# Patient Record
Sex: Female | Born: 1945 | Race: White | Hispanic: No | Smoking: Never smoker
Health system: Southern US, Community
[De-identification: ages and names within clinical notes are randomized; demographics above are authoritative.]

## PROBLEM LIST (undated history)

## (undated) DIAGNOSIS — K219 Gastro-esophageal reflux disease without esophagitis: Secondary | ICD-10-CM

## (undated) DIAGNOSIS — R112 Nausea with vomiting, unspecified: Secondary | ICD-10-CM

## (undated) DIAGNOSIS — F329 Major depressive disorder, single episode, unspecified: Secondary | ICD-10-CM

## (undated) DIAGNOSIS — M1990 Unspecified osteoarthritis, unspecified site: Secondary | ICD-10-CM

## (undated) DIAGNOSIS — J189 Pneumonia, unspecified organism: Secondary | ICD-10-CM

## (undated) DIAGNOSIS — Z9889 Other specified postprocedural states: Secondary | ICD-10-CM

## (undated) DIAGNOSIS — I10 Essential (primary) hypertension: Secondary | ICD-10-CM

## (undated) DIAGNOSIS — G459 Transient cerebral ischemic attack, unspecified: Secondary | ICD-10-CM

## (undated) DIAGNOSIS — F419 Anxiety disorder, unspecified: Secondary | ICD-10-CM

## (undated) DIAGNOSIS — Z9289 Personal history of other medical treatment: Secondary | ICD-10-CM

## (undated) DIAGNOSIS — G43909 Migraine, unspecified, not intractable, without status migrainosus: Secondary | ICD-10-CM

## (undated) DIAGNOSIS — K9041 Non-celiac gluten sensitivity: Secondary | ICD-10-CM

## (undated) DIAGNOSIS — J42 Unspecified chronic bronchitis: Secondary | ICD-10-CM

## (undated) DIAGNOSIS — M810 Age-related osteoporosis without current pathological fracture: Secondary | ICD-10-CM

## (undated) DIAGNOSIS — C73 Malignant neoplasm of thyroid gland: Secondary | ICD-10-CM

## (undated) HISTORY — PX: COLONOSCOPY: SHX174

## (undated) HISTORY — DX: Transient cerebral ischemic attack, unspecified: G45.9

## (undated) HISTORY — PX: TONSILLECTOMY: SUR1361

## (undated) HISTORY — PX: TUBAL LIGATION: SHX77

## (undated) HISTORY — DX: Essential (primary) hypertension: I10

## (undated) HISTORY — PX: MOLE REMOVAL: SHX2046

---
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
## 1997-11-27 ENCOUNTER — Other Ambulatory Visit: Admission: RE | Admit: 1997-11-27 | Discharge: 1997-11-27 | Payer: Self-pay | Admitting: Obstetrics & Gynecology

## 1999-03-25 ENCOUNTER — Other Ambulatory Visit: Admission: RE | Admit: 1999-03-25 | Discharge: 1999-03-25 | Payer: Self-pay | Admitting: Obstetrics & Gynecology

## 2002-06-06 ENCOUNTER — Other Ambulatory Visit: Admission: RE | Admit: 2002-06-06 | Discharge: 2002-06-06 | Payer: Self-pay | Admitting: Obstetrics & Gynecology

## 2012-08-14 ENCOUNTER — Other Ambulatory Visit: Payer: Self-pay | Admitting: Family Medicine

## 2012-08-14 ENCOUNTER — Other Ambulatory Visit (HOSPITAL_COMMUNITY)
Admission: RE | Admit: 2012-08-14 | Discharge: 2012-08-14 | Disposition: A | Discharge status: Home / Self Care | Payer: Medicare Other | Source: Ambulatory Visit | Attending: Family Medicine | Admitting: Family Medicine

## 2012-08-14 DIAGNOSIS — Z124 Encounter for screening for malignant neoplasm of cervix: Secondary | ICD-10-CM | POA: Insufficient documentation

## 2012-08-16 ENCOUNTER — Other Ambulatory Visit: Payer: Self-pay

## 2012-08-16 DIAGNOSIS — Z1231 Encounter for screening mammogram for malignant neoplasm of breast: Secondary | ICD-10-CM

## 2012-09-18 ENCOUNTER — Ambulatory Visit
Admission: RE | Admit: 2012-09-18 | Discharge: 2012-09-18 | Disposition: A | Discharge status: Home / Self Care | Payer: Medicare Other | Source: Ambulatory Visit

## 2012-09-18 DIAGNOSIS — Z1231 Encounter for screening mammogram for malignant neoplasm of breast: Secondary | ICD-10-CM

## 2012-11-04 ENCOUNTER — Other Ambulatory Visit: Payer: Self-pay | Admitting: Gastroenterology

## 2016-01-12 DIAGNOSIS — J189 Pneumonia, unspecified organism: Secondary | ICD-10-CM

## 2016-01-12 HISTORY — DX: Pneumonia, unspecified organism: J18.9

## 2016-01-20 ENCOUNTER — Other Ambulatory Visit: Payer: Self-pay | Admitting: Family Medicine

## 2016-01-20 ENCOUNTER — Ambulatory Visit
Admission: RE | Admit: 2016-01-20 | Discharge: 2016-01-20 | Disposition: A | Discharge status: Home / Self Care | Payer: Medicare Other | Source: Ambulatory Visit | Attending: Family Medicine | Admitting: Family Medicine

## 2016-01-20 DIAGNOSIS — R05 Cough: Secondary | ICD-10-CM

## 2016-01-20 DIAGNOSIS — R059 Cough, unspecified: Secondary | ICD-10-CM

## 2016-02-10 ENCOUNTER — Ambulatory Visit
Admission: RE | Admit: 2016-02-10 | Discharge: 2016-02-10 | Disposition: A | Discharge status: Home / Self Care | Payer: Medicare Other | Source: Ambulatory Visit | Attending: Family Medicine | Admitting: Family Medicine

## 2016-02-10 ENCOUNTER — Other Ambulatory Visit: Payer: Self-pay | Admitting: Family Medicine

## 2016-02-10 DIAGNOSIS — R059 Cough, unspecified: Secondary | ICD-10-CM

## 2016-02-10 DIAGNOSIS — R05 Cough: Secondary | ICD-10-CM

## 2016-02-14 ENCOUNTER — Other Ambulatory Visit: Payer: Self-pay | Admitting: Family Medicine

## 2016-02-14 DIAGNOSIS — R9389 Abnormal findings on diagnostic imaging of other specified body structures: Secondary | ICD-10-CM

## 2016-02-21 ENCOUNTER — Ambulatory Visit
Admission: RE | Admit: 2016-02-21 | Discharge: 2016-02-21 | Disposition: A | Discharge status: Home / Self Care | Payer: Medicare Other | Source: Ambulatory Visit | Attending: Family Medicine | Admitting: Family Medicine

## 2016-02-21 DIAGNOSIS — R9389 Abnormal findings on diagnostic imaging of other specified body structures: Secondary | ICD-10-CM

## 2016-02-25 ENCOUNTER — Other Ambulatory Visit: Payer: Self-pay | Admitting: Family Medicine

## 2016-02-25 DIAGNOSIS — E049 Nontoxic goiter, unspecified: Secondary | ICD-10-CM

## 2016-03-01 ENCOUNTER — Ambulatory Visit
Admission: RE | Admit: 2016-03-01 | Discharge: 2016-03-01 | Disposition: A | Discharge status: Home / Self Care | Payer: Medicare Other | Source: Ambulatory Visit | Attending: Family Medicine | Admitting: Family Medicine

## 2016-03-01 DIAGNOSIS — E049 Nontoxic goiter, unspecified: Secondary | ICD-10-CM

## 2016-03-03 ENCOUNTER — Other Ambulatory Visit: Payer: Self-pay | Admitting: Family Medicine

## 2016-03-03 DIAGNOSIS — Z1231 Encounter for screening mammogram for malignant neoplasm of breast: Secondary | ICD-10-CM

## 2016-03-03 DIAGNOSIS — E042 Nontoxic multinodular goiter: Secondary | ICD-10-CM

## 2016-03-21 ENCOUNTER — Other Ambulatory Visit: Payer: Medicare Other

## 2016-03-21 ENCOUNTER — Inpatient Hospital Stay: Admission: RE | Admit: 2016-03-21 | Payer: Medicare Other | Source: Ambulatory Visit

## 2016-03-27 ENCOUNTER — Ambulatory Visit
Admission: RE | Admit: 2016-03-27 | Discharge: 2016-03-27 | Disposition: A | Discharge status: Home / Self Care | Payer: Medicare Other | Source: Ambulatory Visit | Attending: Family Medicine | Admitting: Family Medicine

## 2016-03-27 DIAGNOSIS — Z1231 Encounter for screening mammogram for malignant neoplasm of breast: Secondary | ICD-10-CM

## 2016-04-04 ENCOUNTER — Other Ambulatory Visit (HOSPITAL_COMMUNITY)
Admission: RE | Admit: 2016-04-04 | Discharge: 2016-04-04 | Disposition: A | Discharge status: Home / Self Care | Payer: Medicare Other | Source: Ambulatory Visit | Attending: Radiology | Admitting: Radiology

## 2016-04-04 ENCOUNTER — Ambulatory Visit
Admission: RE | Admit: 2016-04-04 | Discharge: 2016-04-04 | Disposition: A | Discharge status: Home / Self Care | Payer: Medicare Other | Source: Ambulatory Visit | Attending: Family Medicine | Admitting: Family Medicine

## 2016-04-04 DIAGNOSIS — E042 Nontoxic multinodular goiter: Secondary | ICD-10-CM | POA: Diagnosis present

## 2016-05-02 ENCOUNTER — Other Ambulatory Visit: Payer: Self-pay | Admitting: Surgery

## 2016-05-02 DIAGNOSIS — D440 Neoplasm of uncertain behavior of thyroid gland: Secondary | ICD-10-CM

## 2016-05-03 ENCOUNTER — Other Ambulatory Visit (HOSPITAL_COMMUNITY)
Admission: RE | Admit: 2016-05-03 | Discharge: 2016-05-03 | Disposition: A | Discharge status: Home / Self Care | Payer: Medicare Other | Source: Ambulatory Visit | Attending: Physician Assistant | Admitting: Physician Assistant

## 2016-05-03 ENCOUNTER — Ambulatory Visit
Admission: RE | Admit: 2016-05-03 | Discharge: 2016-05-03 | Disposition: A | Discharge status: Home / Self Care | Payer: Medicare Other | Source: Ambulatory Visit | Attending: Surgery | Admitting: Surgery

## 2016-05-03 DIAGNOSIS — E041 Nontoxic single thyroid nodule: Secondary | ICD-10-CM | POA: Insufficient documentation

## 2016-05-03 DIAGNOSIS — D440 Neoplasm of uncertain behavior of thyroid gland: Secondary | ICD-10-CM

## 2016-05-03 NOTE — Procedures (Signed)
PROCEDURE SUMMARY:  Using direct ultrasound guidance, 5 passes were made using 25 g needles into the nodule within the left lobe of the thyroid.   Ultrasound was used to confirm needle placements on all occasions.   Specimens were sent to Pathology for analysis.  Rafan Sanders S Madalaine Portier PA-C 05/03/2016 3:59 PM

## 2016-05-09 ENCOUNTER — Ambulatory Visit: Payer: Self-pay | Admitting: Surgery

## 2016-05-15 ENCOUNTER — Encounter (HOSPITAL_COMMUNITY): Payer: Self-pay | Admitting: *Deleted

## 2016-05-15 DIAGNOSIS — D440 Neoplasm of uncertain behavior of thyroid gland: Secondary | ICD-10-CM | POA: Diagnosis present

## 2016-05-15 NOTE — Progress Notes (Signed)
Spoke with pt for pre-op call. Pt denies cardiac history, chest pain or sob. Pt states she is not diabetic. Pt states she had a TSH done in January, 2018. Have requested that result and CXR result be faxed to [place] from Dr. Darcus Austin' office.

## 2016-05-15 NOTE — H&P (Signed)
General Surgery [facility]  Anette Riedel DOB: 02/21/1946 Divorced / Language: Cleophus Molt / Race: White Female  History of Present Illness   The patient is a 71 year old female who presents with a thyroid nodule.  Patient is referred by Dr. Darcus Austin for evaluation of bilateral thyroid nodules with cytologic atypia. Patient is scheduled to see Dr. Dagmar Hait in consultation next month. Patient was first found on CT scan of the chest to have an incidental finding of thyroid nodules. She subsequently underwent thyroid ultrasound on March 01, 2016. This showed multiple thyroid nodules. Fine needle aspiration biopsy was performed on the dominant nodules in the left lobe. The 4.1 cm nodule had benign cytopathology. The 1.4 cm nodule in the left upper pole had indeterminate cytopathology. Patient is now referred for surgical assessment and recommendations for management. TSH level was 0.26. Patient has no prior history of thyroid disease. She has never been on thyroid medication. She has had no prior surgery on her neck. She is accompanied today by her son. She denies palpitations or tremors. She denies any compressive symptoms.   Past Surgical History  Tonsillectomy   Diagnostic Studies History  Colonoscopy  1-5 years ago Mammogram  within last year Pap Smear  >5 years ago  Allergies  Sulfa Antibiotics  Rash. Contrast Media Ready-Box *MEDICAL DEVICES AND SUPPLIES*  Anaphylaxis. Psychologist, prison and probation services *MEDICAL DEVICES AND SUPPLIES*  Codeine Sulfate *ANALGESICS - OPIOID*  Shellfish   Medication History  Effexor XR (75MG  Capsule ER 24HR, Oral) Active. Wellbutrin SR (150MG  Tablet ER, Oral) Active. Ambien CR (12.5MG  Tablet ER, Oral) Active. ClonazePAM (0.5MG  Tablet, Oral) Active. Topiramate (25MG  Capsule, Oral) Active. Vitamin D (2000UNIT Tablet, Oral) Active. ValACYclovir HCl (1GM Tablet, Oral as needed) Active. Methocarbamol (500MG   Tablet, Oral as needed) Active. MethylPREDNISolone (4MG  Tablet, Oral) Active. Medications Reconciled  Social History  Alcohol use  Occasional alcohol use. Caffeine use  Tea. No drug use  Tobacco use  Never smoker.  Family History  Alcohol Abuse  Brother, Mother. Arthritis  Mother. Cerebrovascular Accident  Mother. Diabetes Mellitus  Brother. Hypertension  Mother. Respiratory Condition  Father.  Pregnancy / Birth History  Age at menarche  1 years. Age of menopause  23-50 Contraceptive History  Oral contraceptives. Gravida  3 Length (months) of breastfeeding  12-24 Maternal age  38-20 Para  3  Other Problems  Anxiety Disorder  Depression  Gastroesophageal Reflux Disease  Migraine Headache  Thyroid Disease    Review of Systems General Present- Fatigue. Not Present- Appetite Loss, Chills, Fever, Night Sweats, Weight Gain and Weight Loss. Skin Not Present- Change in Wart/Mole, Dryness, Hives, Jaundice, New Lesions, Non-Healing Wounds, Rash and Ulcer. HEENT Present- Hoarseness, Seasonal Allergies and Wears glasses/contact lenses. Not Present- Earache, Hearing Loss, Nose Bleed, Oral Ulcers, Ringing in the Ears, Sinus Pain, Sore Throat, Visual Disturbances and Yellow Eyes. Breast Not Present- Breast Mass, Breast Pain, Nipple Discharge and Skin Changes. Gastrointestinal Not Present- Abdominal Pain, Bloating, Bloody Stool, Change in Bowel Habits, Chronic diarrhea, Constipation, Difficulty Swallowing, Excessive gas, Gets full quickly at meals, Hemorrhoids, Indigestion, Nausea, Rectal Pain and Vomiting. Female Genitourinary Not Present- Frequency, Nocturia, Painful Urination, Pelvic Pain and Urgency. Musculoskeletal Present- Joint Pain, Muscle Pain and Muscle Weakness. Not Present- Back Pain, Joint Stiffness and Swelling of Extremities. Neurological Not Present- Decreased Memory, Fainting, Headaches, Numbness, Seizures, Tingling, Tremor, Trouble walking and  Weakness. Endocrine Present- Cold Intolerance, Hair Changes and Heat Intolerance. Not Present- Excessive Hunger, Hot flashes and New  Diabetes.  Vitals Weight: 150 lb Height: 66in Body Surface Area: 1.77 m Body Mass Index: 24.21 kg/m  Temp.: 97.58F(Oral)  Pulse: 97 (Regular)  BP: 120/78 (Sitting, Left Arm, Standard)   Physical Exam  The physical exam findings are as follows: Note:General - appears comfortable, no distress; not diaphorectic  HEENT - normocephalic; sclerae clear, gaze conjugate; mucous membranes moist, dentition good; voice normal  Neck - symmetric on extension; no palpable anterior or posterior cervical adenopathy; palpable dominant nodule left thyroid lobe with swallowing maneuver, extends beneath the clavicle, smooth, nontender, difficult to estimate overall size  Chest - clear bilaterally without rhonchi, rales, or wheeze  Cor - regular rhythm with normal rate; no significant murmur  Ext - non-tender without significant edema or lymphedema  Neuro - grossly intact; no tremor    Assessment & Plan   MULTIPLE THYROID NODULES (E04.2) NEOPLASM OF UNCERTAIN BEHAVIOR OF THYROID GLAND (D44.0)  Pt Education - Pamphlet Given - The Thyroid Book: discussed with patient and provided information.  Follow Up - Call [facility] office after tests / studies doneto discuss further plans  Patient presents with bilateral thyroid nodules. The 1.4 cm nodule in the left upper pole underwent biopsy which shows indeterminate cytopathology. Patient and her son are provided with written literature on thyroid disease to review at home.  Patient is asymptomatic. She has no compressive symptoms. We discussed options for management including surgical resection and repeat biopsy for molecular genetic testing. Patient would like to proceed with repeat fine-needle aspiration biopsy and molecular genetic testing in order to stratify her into either a high risk or low risk group  for malignancy. I agree with this plan. We will make arrangements for repeat biopsy in the near future.  If the patient is in a high risk group, we will ask her to return and discuss the need for total thyroidectomy. If the patient is in a low risk group, then I think she can be safely managed with repeat ultrasound in 1 year.  Patient is scheduled to see Dr. Dagmar Hait in consultation next month. I am sure he will do further laboratory studies and address her borderline hyperthyroid state.  Patient will undergo repeat biopsy for molecular genetic testing. We will contact her with those results when they are available and make further plans.  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  Will proceed with total thyroidectomy and limited node dissection if necessary.  The risks and benefits of the procedure have been discussed at length with the patient.  The patient understands the proposed procedure, potential alternative treatments, and the course of recovery to be expected.  All of the patient's questions have been answered at this time.  The patient wishes to proceed with surgery.  Earnstine Regal, MD, [facility] Office: [phone]

## 2016-05-16 ENCOUNTER — Ambulatory Visit (HOSPITAL_COMMUNITY): Payer: Medicare Other | Admitting: Anesthesiology

## 2016-05-16 ENCOUNTER — Ambulatory Visit (HOSPITAL_COMMUNITY): Payer: Medicare Other

## 2016-05-16 ENCOUNTER — Encounter (HOSPITAL_COMMUNITY)
Admission: RE | Disposition: A | Discharge status: Home / Self Care | Payer: Self-pay | Source: Ambulatory Visit | Attending: Surgery

## 2016-05-16 ENCOUNTER — Encounter (HOSPITAL_COMMUNITY): Payer: Self-pay | Admitting: *Deleted

## 2016-05-16 ENCOUNTER — Observation Stay (HOSPITAL_COMMUNITY)
Admission: RE | Admit: 2016-05-16 | Discharge: 2016-05-17 | Disposition: A | Discharge status: Home / Self Care | Payer: Medicare Other | Source: Ambulatory Visit | Attending: Surgery | Admitting: Surgery

## 2016-05-16 DIAGNOSIS — E042 Nontoxic multinodular goiter: Secondary | ICD-10-CM | POA: Insufficient documentation

## 2016-05-16 DIAGNOSIS — F329 Major depressive disorder, single episode, unspecified: Secondary | ICD-10-CM | POA: Diagnosis not present

## 2016-05-16 DIAGNOSIS — C73 Malignant neoplasm of thyroid gland: Secondary | ICD-10-CM | POA: Diagnosis not present

## 2016-05-16 DIAGNOSIS — Z79899 Other long term (current) drug therapy: Secondary | ICD-10-CM | POA: Diagnosis not present

## 2016-05-16 DIAGNOSIS — Z01818 Encounter for other preprocedural examination: Secondary | ICD-10-CM

## 2016-05-16 DIAGNOSIS — E063 Autoimmune thyroiditis: Secondary | ICD-10-CM | POA: Diagnosis not present

## 2016-05-16 DIAGNOSIS — Z7952 Long term (current) use of systemic steroids: Secondary | ICD-10-CM | POA: Diagnosis not present

## 2016-05-16 DIAGNOSIS — E041 Nontoxic single thyroid nodule: Secondary | ICD-10-CM | POA: Diagnosis present

## 2016-05-16 DIAGNOSIS — D440 Neoplasm of uncertain behavior of thyroid gland: Secondary | ICD-10-CM | POA: Diagnosis present

## 2016-05-16 HISTORY — DX: Unspecified osteoarthritis, unspecified site: M19.90

## 2016-05-16 HISTORY — DX: Pneumonia, unspecified organism: J18.9

## 2016-05-16 HISTORY — PX: TOTAL THYROIDECTOMY: SHX2547

## 2016-05-16 HISTORY — DX: Non-celiac gluten sensitivity: K90.41

## 2016-05-16 HISTORY — DX: Personal history of other medical treatment: Z92.89

## 2016-05-16 HISTORY — DX: Unspecified chronic bronchitis: J42

## 2016-05-16 HISTORY — DX: Major depressive disorder, single episode, unspecified: F32.9

## 2016-05-16 HISTORY — DX: Age-related osteoporosis without current pathological fracture: M81.0

## 2016-05-16 HISTORY — DX: Other specified postprocedural states: R11.2

## 2016-05-16 HISTORY — DX: Anxiety disorder, unspecified: F41.9

## 2016-05-16 HISTORY — DX: Other specified postprocedural states: Z98.890

## 2016-05-16 HISTORY — DX: Migraine, unspecified, not intractable, without status migrainosus: G43.909

## 2016-05-16 HISTORY — DX: Gastro-esophageal reflux disease without esophagitis: K21.9

## 2016-05-16 HISTORY — PX: THYROIDECTOMY: SHX17

## 2016-05-16 HISTORY — DX: Malignant neoplasm of thyroid gland: C73

## 2016-05-16 LAB — CBC
HEMATOCRIT: 40.5 % (ref 36.0–46.0)
Hemoglobin: 13.7 g/dL (ref 12.0–15.0)
MCH: 30.3 pg (ref 26.0–34.0)
MCHC: 33.8 g/dL (ref 30.0–36.0)
MCV: 89.6 fL (ref 78.0–100.0)
PLATELETS: 233 10*3/uL (ref 150–400)
RBC: 4.52 MIL/uL (ref 3.87–5.11)
RDW: 13.5 % (ref 11.5–15.5)
WBC: 7.7 10*3/uL (ref 4.0–10.5)

## 2016-05-16 LAB — BASIC METABOLIC PANEL
Anion gap: 10 (ref 5–15)
BUN: 8 mg/dL (ref 6–20)
CALCIUM: 9.3 mg/dL (ref 8.9–10.3)
CO2: 23 mmol/L (ref 22–32)
CREATININE: 1.05 mg/dL — AB (ref 0.44–1.00)
Chloride: 111 mmol/L (ref 101–111)
GFR calc Af Amer: >60 mL/min (ref >60)
GFR, EST NON AFRICAN AMERICAN: 53 mL/min — AB (ref >60)
GLUCOSE: 102 mg/dL — AB (ref 65–99)
POTASSIUM: 3.5 mmol/L (ref 3.5–5.1)
SODIUM: 144 mmol/L (ref 135–145)

## 2016-05-16 SURGERY — THYROIDECTOMY
Anesthesia: General | Site: Neck

## 2016-05-16 MED ORDER — ALPRAZOLAM 0.25 MG PO TABS: 0.2500 mg | ORAL_TABLET | Freq: Two times a day (BID) | ORAL | Status: DC | PRN

## 2016-05-16 MED ORDER — SUGAMMADEX SODIUM 200 MG/2ML IV SOLN
INTRAVENOUS | Status: AC
  Filled 2016-05-16: qty 2

## 2016-05-16 MED ORDER — ACETAMINOPHEN 325 MG PO TABS
650.0000 mg | ORAL_TABLET | Freq: Four times a day (QID) | ORAL | Status: DC | PRN
Start: 2016-05-16 — End: 2016-05-17

## 2016-05-16 MED ORDER — MIDAZOLAM HCL 2 MG/2ML IJ SOLN
INTRAMUSCULAR | Status: AC
  Filled 2016-05-16: qty 2

## 2016-05-16 MED ORDER — LACTATED RINGERS IV SOLN
INTRAVENOUS | Status: DC
  Administered 2016-05-16 (×3): via INTRAVENOUS

## 2016-05-16 MED ORDER — PHENYLEPHRINE 40 MCG/ML (10ML) SYRINGE FOR IV PUSH (FOR BLOOD PRESSURE SUPPORT)
PREFILLED_SYRINGE | INTRAVENOUS | Status: AC
  Filled 2016-05-16: qty 10

## 2016-05-16 MED ORDER — HEMOSTATIC AGENTS (NO CHARGE) OPTIME
TOPICAL | Status: DC | PRN
  Administered 2016-05-16: 1 via TOPICAL

## 2016-05-16 MED ORDER — ONDANSETRON 4 MG PO TBDP: 4.0000 mg | ORAL_TABLET | Freq: Four times a day (QID) | ORAL | Status: DC | PRN

## 2016-05-16 MED ORDER — LIDOCAINE 2% (20 MG/ML) 5 ML SYRINGE
INTRAMUSCULAR | Status: AC
  Filled 2016-05-16: qty 10

## 2016-05-16 MED ORDER — ONDANSETRON HCL 4 MG/2ML IJ SOLN
INTRAMUSCULAR | Status: AC
  Filled 2016-05-16: qty 2

## 2016-05-16 MED ORDER — CALCIUM CARBONATE 1250 (500 CA) MG PO TABS
2.0000 | ORAL_TABLET | Freq: Three times a day (TID) | ORAL | Status: DC
  Administered 2016-05-16 – 2016-05-17 (×2): 1000 mg via ORAL
  Filled 2016-05-16 (×2): qty 1

## 2016-05-16 MED ORDER — FENTANYL CITRATE (PF) 100 MCG/2ML IJ SOLN
INTRAMUSCULAR | Status: DC | PRN
  Administered 2016-05-16 (×2): 50 ug via INTRAVENOUS
  Administered 2016-05-16: 25 ug via INTRAVENOUS
  Administered 2016-05-16: 50 ug via INTRAVENOUS
  Administered 2016-05-16: 25 ug via INTRAVENOUS

## 2016-05-16 MED ORDER — LACTATED RINGERS IV SOLN: INTRAVENOUS | Status: DC

## 2016-05-16 MED ORDER — ACETAMINOPHEN 650 MG RE SUPP: 650.0000 mg | Freq: Four times a day (QID) | RECTAL | Status: DC | PRN

## 2016-05-16 MED ORDER — PROPOFOL 10 MG/ML IV BOLUS
INTRAVENOUS | Status: AC
  Filled 2016-05-16: qty 20

## 2016-05-16 MED ORDER — VENLAFAXINE HCL ER 75 MG PO CP24
450.0000 mg | ORAL_CAPSULE | Freq: Every day | ORAL | Status: DC
  Administered 2016-05-17: 225 mg via ORAL
  Filled 2016-05-16 (×2): qty 6

## 2016-05-16 MED ORDER — ZOLPIDEM TARTRATE 5 MG PO TABS
5.0000 mg | ORAL_TABLET | Freq: Every evening | ORAL | Status: DC | PRN
  Administered 2016-05-16: 5 mg via ORAL
  Filled 2016-05-16: qty 1

## 2016-05-16 MED ORDER — LABETALOL HCL 5 MG/ML IV SOLN
INTRAVENOUS | Status: AC
  Filled 2016-05-16: qty 4

## 2016-05-16 MED ORDER — KCL IN DEXTROSE-NACL 20-5-0.45 MEQ/L-%-% IV SOLN
INTRAVENOUS | Status: DC
  Administered 2016-05-16: 15:00:00 via INTRAVENOUS
  Filled 2016-05-16: qty 1000

## 2016-05-16 MED ORDER — CHLORHEXIDINE GLUCONATE CLOTH 2 % EX PADS: 6.0000 | MEDICATED_PAD | Freq: Once | CUTANEOUS | Status: DC

## 2016-05-16 MED ORDER — HYDROMORPHONE HCL 2 MG/ML IJ SOLN
1.0000 mg | INTRAMUSCULAR | Status: DC | PRN
  Administered 2016-05-16: 1 mg via INTRAVENOUS
  Filled 2016-05-16: qty 1

## 2016-05-16 MED ORDER — CEFAZOLIN SODIUM-DEXTROSE 2-4 GM/100ML-% IV SOLN
INTRAVENOUS | Status: AC
  Filled 2016-05-16: qty 100

## 2016-05-16 MED ORDER — MIDAZOLAM HCL 5 MG/5ML IJ SOLN
INTRAMUSCULAR | Status: DC | PRN
  Administered 2016-05-16: 0.5 mg via INTRAVENOUS

## 2016-05-16 MED ORDER — ROCURONIUM BROMIDE 50 MG/5ML IV SOSY
PREFILLED_SYRINGE | INTRAVENOUS | Status: AC
  Filled 2016-05-16: qty 10

## 2016-05-16 MED ORDER — ONDANSETRON HCL 4 MG/2ML IJ SOLN
INTRAMUSCULAR | Status: DC | PRN
  Administered 2016-05-16: 4 mg via INTRAVENOUS

## 2016-05-16 MED ORDER — ROCURONIUM BROMIDE 50 MG/5ML IV SOSY
PREFILLED_SYRINGE | INTRAVENOUS | Status: AC
  Filled 2016-05-16: qty 5

## 2016-05-16 MED ORDER — ARTIFICIAL TEARS OP OINT
TOPICAL_OINTMENT | OPHTHALMIC | Status: AC
  Filled 2016-05-16: qty 3.5

## 2016-05-16 MED ORDER — ROCURONIUM BROMIDE 100 MG/10ML IV SOLN
INTRAVENOUS | Status: DC | PRN
  Administered 2016-05-16: 50 mg via INTRAVENOUS

## 2016-05-16 MED ORDER — DEXAMETHASONE SODIUM PHOSPHATE 10 MG/ML IJ SOLN
INTRAMUSCULAR | Status: AC
  Filled 2016-05-16: qty 1

## 2016-05-16 MED ORDER — DEXAMETHASONE SODIUM PHOSPHATE 10 MG/ML IJ SOLN
INTRAMUSCULAR | Status: DC | PRN
  Administered 2016-05-16: 10 mg via INTRAVENOUS

## 2016-05-16 MED ORDER — BUPROPION HCL ER (XL) 150 MG PO TB24
150.0000 mg | ORAL_TABLET | Freq: Every day | ORAL | Status: DC
  Administered 2016-05-17: 150 mg via ORAL
  Filled 2016-05-16: qty 1

## 2016-05-16 MED ORDER — SUGAMMADEX SODIUM 200 MG/2ML IV SOLN
INTRAVENOUS | Status: DC | PRN
  Administered 2016-05-16: 130 mg via INTRAVENOUS

## 2016-05-16 MED ORDER — 0.9 % SODIUM CHLORIDE (POUR BTL) OPTIME
TOPICAL | Status: DC | PRN
  Administered 2016-05-16: 1000 mL

## 2016-05-16 MED ORDER — ONDANSETRON HCL 4 MG/2ML IJ SOLN: 4.0000 mg | Freq: Four times a day (QID) | INTRAMUSCULAR | Status: DC | PRN

## 2016-05-16 MED ORDER — HYDROMORPHONE HCL 1 MG/ML IJ SOLN
INTRAMUSCULAR | Status: AC
  Filled 2016-05-16: qty 0.5

## 2016-05-16 MED ORDER — HYDROMORPHONE HCL 1 MG/ML IJ SOLN
0.2500 mg | INTRAMUSCULAR | Status: DC | PRN
  Administered 2016-05-16 (×2): 0.5 mg via INTRAVENOUS

## 2016-05-16 MED ORDER — LIDOCAINE HCL 4 % EX SOLN
CUTANEOUS | Status: DC | PRN
  Administered 2016-05-16: 2 mL via TOPICAL

## 2016-05-16 MED ORDER — CLONAZEPAM 0.5 MG PO TABS
0.5000 mg | ORAL_TABLET | Freq: Every day | ORAL | Status: DC
  Administered 2016-05-16: 0.5 mg via ORAL
  Filled 2016-05-16: qty 1

## 2016-05-16 MED ORDER — FENTANYL CITRATE (PF) 100 MCG/2ML IJ SOLN
INTRAMUSCULAR | Status: AC
  Filled 2016-05-16: qty 4

## 2016-05-16 MED ORDER — HYDROCODONE-ACETAMINOPHEN 5-325 MG PO TABS
1.0000 | ORAL_TABLET | ORAL | Status: DC | PRN
  Administered 2016-05-16 – 2016-05-17 (×2): 2 via ORAL
  Filled 2016-05-16: qty 1
  Filled 2016-05-16: qty 2
  Filled 2016-05-16: qty 1

## 2016-05-16 MED ORDER — ARTIFICIAL TEARS OP OINT
TOPICAL_OINTMENT | OPHTHALMIC | Status: DC | PRN
  Administered 2016-05-16: 1 via OPHTHALMIC

## 2016-05-16 MED ORDER — CEFAZOLIN SODIUM-DEXTROSE 2-4 GM/100ML-% IV SOLN
2.0000 g | INTRAVENOUS | Status: AC
  Administered 2016-05-16: 2 g via INTRAVENOUS

## 2016-05-16 MED ORDER — PROPOFOL 10 MG/ML IV BOLUS
INTRAVENOUS | Status: DC | PRN
  Administered 2016-05-16: 120 mg via INTRAVENOUS

## 2016-05-16 MED ORDER — LIDOCAINE HCL (CARDIAC) 20 MG/ML IV SOLN
INTRAVENOUS | Status: DC | PRN
  Administered 2016-05-16: 60 mg via INTRAVENOUS

## 2016-05-16 MED ORDER — HYDROMORPHONE HCL 1 MG/ML IJ SOLN
INTRAMUSCULAR | Status: AC
  Administered 2016-05-16: 0.5 mg via INTRAVENOUS
  Filled 2016-05-16: qty 0.5

## 2016-05-16 MED ORDER — LABETALOL HCL 5 MG/ML IV SOLN
INTRAVENOUS | Status: DC | PRN
  Administered 2016-05-16 (×3): 2.5 mg via INTRAVENOUS

## 2016-05-16 MED ORDER — PHENYLEPHRINE HCL 10 MG/ML IJ SOLN
INTRAVENOUS | Status: DC | PRN
  Administered 2016-05-16: 25 ug/min via INTRAVENOUS

## 2016-05-16 MED ORDER — TOPIRAMATE 25 MG PO TABS
75.0000 mg | ORAL_TABLET | Freq: Every day | ORAL | Status: DC
  Administered 2016-05-16: 75 mg via ORAL
  Filled 2016-05-16: qty 3

## 2016-05-16 SURGICAL SUPPLY — 46 items
ATTRACTOMAT 16X20 MAGNETIC DRP (DRAPES) ×3 IMPLANT
BLADE CLIPPER SURG (BLADE) IMPLANT
BLADE SURG 10 STRL SS (BLADE) ×3 IMPLANT
BLADE SURG 15 STRL LF DISP TIS (BLADE) ×1 IMPLANT
BLADE SURG 15 STRL SS (BLADE) ×2
CANISTER SUCT 3000ML PPV (MISCELLANEOUS) ×3 IMPLANT
CHLORAPREP W/TINT 10.5 ML (MISCELLANEOUS) ×3 IMPLANT
CLIP TI MEDIUM 24 (CLIP) ×3 IMPLANT
CLIP TI WIDE RED SMALL 24 (CLIP) ×3 IMPLANT
CLOSURE WOUND 1/2 X4 (GAUZE/BANDAGES/DRESSINGS) ×1
COVER SURGICAL LIGHT HANDLE (MISCELLANEOUS) ×3 IMPLANT
CRADLE DONUT ADULT HEAD (MISCELLANEOUS) ×3 IMPLANT
DRAPE LAPAROTOMY 100X72 PEDS (DRAPES) ×3 IMPLANT
DRAPE UTILITY XL STRL (DRAPES) ×3 IMPLANT
ELECT CAUTERY BLADE 6.4 (BLADE) ×3 IMPLANT
ELECT REM PT RETURN 9FT ADLT (ELECTROSURGICAL) ×3
ELECTRODE REM PT RTRN 9FT ADLT (ELECTROSURGICAL) ×1 IMPLANT
GAUZE SPONGE 4X4 12PLY STRL (GAUZE/BANDAGES/DRESSINGS) ×3 IMPLANT
GAUZE SPONGE 4X4 16PLY XRAY LF (GAUZE/BANDAGES/DRESSINGS) ×3 IMPLANT
GLOVE SURG ORTHO 8.0 STRL STRW (GLOVE) ×3 IMPLANT
GOWN STRL REUS W/ TWL LRG LVL3 (GOWN DISPOSABLE) ×1 IMPLANT
GOWN STRL REUS W/ TWL XL LVL3 (GOWN DISPOSABLE) ×1 IMPLANT
GOWN STRL REUS W/TWL LRG LVL3 (GOWN DISPOSABLE) ×2
GOWN STRL REUS W/TWL XL LVL3 (GOWN DISPOSABLE) ×2
HEMOSTAT SURGICEL 2X4 FIBR (HEMOSTASIS) ×3 IMPLANT
ILLUMINATOR WAVEGUIDE N/F (MISCELLANEOUS) ×3 IMPLANT
KIT BASIN OR (CUSTOM PROCEDURE TRAY) ×3 IMPLANT
KIT ROOM TURNOVER OR (KITS) ×3 IMPLANT
LIGHT WAVEGUIDE WIDE FLAT (MISCELLANEOUS) IMPLANT
NS IRRIG 1000ML POUR BTL (IV SOLUTION) ×3 IMPLANT
PACK SURGICAL SETUP 50X90 (CUSTOM PROCEDURE TRAY) ×3 IMPLANT
PAD ARMBOARD 7.5X6 YLW CONV (MISCELLANEOUS) ×3 IMPLANT
PENCIL BUTTON HOLSTER BLD 10FT (ELECTRODE) ×3 IMPLANT
SHEARS HARMONIC 9CM CVD (BLADE) ×3 IMPLANT
SPECIMEN JAR MEDIUM (MISCELLANEOUS) ×3 IMPLANT
SPONGE INTESTINAL PEANUT (DISPOSABLE) ×3 IMPLANT
STRIP CLOSURE SKIN 1/2X4 (GAUZE/BANDAGES/DRESSINGS) ×2 IMPLANT
SUT MNCRL AB 4-0 PS2 18 (SUTURE) ×3 IMPLANT
SUT SILK 2 0 (SUTURE) ×4
SUT SILK 2-0 18XBRD TIE 12 (SUTURE) ×2 IMPLANT
SUT VIC AB 3-0 SH 18 (SUTURE) ×6 IMPLANT
SYR BULB 3OZ (MISCELLANEOUS) ×3 IMPLANT
TOWEL OR 17X24 6PK STRL BLUE (TOWEL DISPOSABLE) ×3 IMPLANT
TOWEL OR 17X26 10 PK STRL BLUE (TOWEL DISPOSABLE) ×3 IMPLANT
TUBE CONNECTING 12'X1/4 (SUCTIONS) ×1
TUBE CONNECTING 12X1/4 (SUCTIONS) ×2 IMPLANT

## 2016-05-16 NOTE — Anesthesia Procedure Notes (Signed)
Procedure Name: Intubation Date/Time: 05/16/2016 10:00 AM Performed by: Suzy Bouchard Pre-anesthesia Checklist: Patient identified, Emergency Drugs available, Suction available, Patient being monitored and Timeout performed Patient Re-evaluated:Patient Re-evaluated prior to inductionOxygen Delivery Method: Circle system utilized Preoxygenation: Pre-oxygenation with 100% oxygen Intubation Type: IV induction Ventilation: Mask ventilation without difficulty Laryngoscope Size: Miller and 2 Grade View: Grade I Tube type: Oral Tube size: 7.0 mm Number of attempts: 1 Airway Equipment and Method: Stylet and LTA kit utilized Placement Confirmation: ETT inserted through vocal cords under direct vision,  positive ETCO2 and breath sounds checked- equal and bilateral Secured at: 21 cm Tube secured with: Tape Dental Injury: Teeth and Oropharynx as per pre-operative assessment

## 2016-05-16 NOTE — Op Note (Signed)
Procedure Note  Pre-operative Diagnosis:  Thyroid neoplasm of uncertain behavior, Bethesda Category V  Post-operative Diagnosis:  same  Surgeon:  Earnstine Regal, MD, FACS  Assistant:  Romana Juniper, MD   Procedure:  Total thyroidectomy  Anesthesia:  General  Estimated Blood Loss:  minimal  Drains: none         Specimen: thyroid to pathology  Indications:  The patient is a 71 year old female who presents with a thyroid nodule.  Patient is referred by Dr. Darcus Austin for evaluation of bilateral thyroid nodules with cytologic atypia. Patient is scheduled to see Dr. Dagmar Hait in consultation next month. Patient was first found on CT scan of the chest to have an incidental finding of thyroid nodules. She subsequently underwent thyroid ultrasound on March 01, 2016. This showed multiple thyroid nodules. Fine needle aspiration biopsy was performed on the dominant nodules in the left lobe. The 4.1 cm nodule had benign cytopathology. The 1.4 cm nodule in the left upper pole had indeterminate cytopathology. Patient is now referred for surgical assessment and recommendations for management.  Procedure Details: Procedure was done in OR #9 at the [facility].  The patient was brought to the operating room and placed in a supine position on the operating room table.  Following administration of general anesthesia, the patient was positioned and then prepped and draped in the usual aseptic fashion.  After ascertaining that an adequate level of anesthesia had been achieved, a Kocher incision was made with #15 blade.  Dissection was carried through subcutaneous tissues and platysma. Hemostasis was achieved with the electrocautery.  Skin flaps were elevated cephalad and caudad from the thyroid notch to the sternal notch.  The Mahorner self-retaining retractor was placed for exposure.  Strap muscles were incised in the midline and dissection was begun on the left side.  Strap muscles were  reflected laterally.  Left thyroid lobe was enlarged and nodular with a firm approximately 1 cm mass in the superior pole.  The left lobe was gently mobilized with blunt dissection.  Superior pole vessels were dissected out and divided individually between small and medium Ligaclips with the Harmonic scalpel.  The thyroid lobe was rolled anteriorly.  Branches of the inferior thyroid artery were divided between small Ligaclips with the Harmonic scalpel.  Inferior venous tributaries were divided between Ligaclips.  Both the superior and inferior parathyroid glands were identified and preserved on their vascular pedicles.  The recurrent laryngeal nerve was identified and preserved along its course.  The ligament of Gwenlyn Found was released with the electrocautery and the gland was mobilized onto the anterior trachea. Isthmus was mobilized across the midline.  There was a moderate pyramidal lobe present which was resected with the isthmus.  Dry pack was placed in the left neck.  Next, the right thyroid lobe was gently mobilized with blunt dissection.  Right thyroid lobe was moderately enlarged and multinodular.  Superior pole vessels were dissected out and divided between small and medium Ligaclips with the Harmonic scalpel.  Superior parathyroid was identified and preserved.  Inferior venous tributaries were divided between medium Ligaclips with the Harmonic scalpel.  The right thyroid lobe was rolled anteriorly and the branches of the inferior thyroid artery divided between small Ligaclips.  The right recurrent laryngeal nerve was identified and preserved along its course.  The ligament of Gwenlyn Found was released with the electrocautery.  The right thyroid lobe was mobilized onto the anterior trachea and the remainder of the thyroid was dissected off  the anterior trachea and the thyroid was completely excised.  A suture was used to mark the left lobe. The entire thyroid gland was submitted to pathology for review.  The neck  was irrigated with warm saline.  Fibrillar was placed throughout the operative field.  Strap muscles were reapproximated in the midline with interrupted 3-0 Vicryl sutures.  Platysma was closed with interrupted 3-0 Vicryl sutures.  Skin was closed with a running 4-0 Monocryl subcuticular suture.  Wound was washed and dried and steri-strips were applied.  Dry gauze dressing was placed.  The patient was awakened from anesthesia and brought to the recovery room.  The patient tolerated the procedure well.   Earnstine Regal, MD, [facility] Office: [phone]

## 2016-05-16 NOTE — Transfer of Care (Signed)
Immediate Anesthesia Transfer of Care Note  Patient: Rhona Leavens  Procedure(s) Performed: Procedure(s): TOTAL THYROIDECTOMY (N/A)  Patient Location: PACU  Anesthesia Type:General  Level of Consciousness: sedated  Airway & Oxygen Therapy: Patient Spontanous Breathing and Patient connected to nasal cannula oxygen  Post-op Assessment: Report given to RN, Post -op Vital signs reviewed and stable and Patient moving all extremities X 4  Post vital signs: Reviewed and stable  Last Vitals:  Vitals:   05/16/16 0805  BP: 135/66  Pulse: 84  Resp: 18  Temp: 37.1 C    Last Pain:  Vitals:   05/16/16 0805  TempSrc: Oral      Patients Stated Pain Goal: 7 ([id number])  Complications: No apparent anesthesia complications

## 2016-05-16 NOTE — Care Management Note (Signed)
Case Management Note  Patient Details  Name: Kelly Dennis MRN: [id number] Date of Birth: [birth year]  Subjective/Objective:                    Action/Plan:  05-16-16 Total thyroidectomy Expected Discharge Date:                  Expected Discharge Plan:  Home/Self Care  In-House Referral:     Discharge planning Services     Post Acute Care Choice:    Choice offered to:     DME Arranged:    DME Agency:     HH Arranged:    [place] Agency:     Status of Service:  In process, will continue to follow  If discussed at Long Length of Stay Meetings, dates discussed:    Additional Comments:  Marilu Favre, RN 05/16/2016, 3:43 PM

## 2016-05-16 NOTE — Anesthesia Preprocedure Evaluation (Addendum)
Anesthesia Evaluation  Patient identified by MRN, date of birth, ID band Patient awake    Reviewed: Allergy & Precautions, H&P , NPO status , Patient's Chart, lab work & pertinent test results  History of Anesthesia Complications (+) PONV  Airway Mallampati: II  TM Distance: >3 FB Neck ROM: Full    Dental no notable dental hx. (+) Teeth Intact, Dental Advisory Given,    Pulmonary neg pulmonary ROS,    Pulmonary exam normal breath sounds clear to auscultation       Cardiovascular negative cardio ROS   Rhythm:Regular Rate:Normal     Neuro/Psych  Headaches, Anxiety Depression negative psych ROS   GI/Hepatic negative GI ROS, Neg liver ROS,   Endo/Other  negative endocrine ROS  Renal/GU negative Renal ROS  negative genitourinary   Musculoskeletal  (+) Arthritis , Osteoarthritis,    Abdominal   Peds  Hematology negative hematology ROS (+)   Anesthesia Other Findings   Reproductive/Obstetrics negative OB ROS                           Anesthesia Physical Anesthesia Plan  ASA: II  Anesthesia Plan: General   Post-op Pain Management:    Induction: Intravenous  Airway Management Planned: Oral ETT  Additional Equipment:   Intra-op Plan:   Post-operative Plan: Extubation in OR  Informed Consent: I have reviewed the patients History and Physical, chart, labs and discussed the procedure including the risks, benefits and alternatives for the proposed anesthesia with the patient or authorized representative who has indicated his/her understanding and acceptance.   Dental advisory given  Plan Discussed with: CRNA  Anesthesia Plan Comments:         Anesthesia Quick Evaluation

## 2016-05-16 NOTE — Anesthesia Postprocedure Evaluation (Signed)
Anesthesia Post Note  Patient: Kelly Dennis  Procedure(s) Performed: Procedure(s) (LRB): TOTAL THYROIDECTOMY (N/A)  Patient location during evaluation: PACU Anesthesia Type: General Level of consciousness: awake and alert Pain management: pain level controlled Vital Signs Assessment: post-procedure vital signs reviewed and stable Respiratory status: spontaneous breathing, nonlabored ventilation, respiratory function stable and patient connected to nasal cannula oxygen Cardiovascular status: blood pressure returned to baseline and stable Postop Assessment: no signs of nausea or vomiting Anesthetic complications: no       Last Vitals:  Vitals:   05/16/16 1237 05/16/16 1245  BP:  138/78  Pulse: 79 78  Resp: 11 (!) 23  Temp:  36.5 C    Last Pain:  Vitals:   05/16/16 1237  TempSrc:   PainSc: 5     LLE Motor Response: Purposeful movement;Responds to commands (05/16/16 1245) LLE Sensation: Other (Comment) ("little tingly") (05/16/16 1245) RLE Motor Response: Purposeful movement;Responds to commands (05/16/16 1245) RLE Sensation: Full sensation (05/16/16 1245)      Deannah Rossi,W. EDMOND

## 2016-05-16 NOTE — Interval H&P Note (Signed)
History and Physical Interval Note:  05/16/2016 9:32 AM  Kelly Dennis  has presented today for surgery, with the diagnosis of Thyroid neoplasm of uncertain behavior  The various methods of treatment have been discussed with the patient and family. After consideration of risks, benefits and other options for treatment, the patient has consented to    Procedure(s): TOTAL THYROIDECTOMY WITH LIMITED LYMPH NODE DISSECTION (N/A) as a surgical intervention .    The patient's history has been reviewed, patient examined, no change in status, stable for surgery.  I have reviewed the patient's chart and labs.  Questions were answered to the patient's satisfaction.    Earnstine Regal, MD, [facility] Office: [place]

## 2016-05-16 NOTE — Progress Notes (Signed)
Responded to page to create advanced directive for pt in hr before her scheduled surgery. Discussed form options with pt and son in rm. Executed document, w/ copies for family and in pt's [facility] file. Provided emotional/spiritual support and prayer. Chaplain available for f/u.   05/16/16 0900  Clinical Encounter Type  Visited With Patient and family together  Visit Type Initial;Psychological support;Spiritual support;Social support;Pre-op  Referral From Nurse  Spiritual Encounters  Spiritual Needs Brochure;Prayer;Emotional  Stress Factors  Patient Stress Factors Health changes  Family Stress Factors Family relationships;Health changes   Gerrit Heck, Chaplain

## 2016-05-16 NOTE — Progress Notes (Signed)
Patient c/o pinching at IV hub and slight redness. Redness noted at sight of numbing. Dressing removed and new dressing placed. Patient states it feels better. Educated on what to notify for.

## 2016-05-17 ENCOUNTER — Encounter (HOSPITAL_COMMUNITY): Payer: Self-pay | Admitting: Surgery

## 2016-05-17 DIAGNOSIS — C73 Malignant neoplasm of thyroid gland: Secondary | ICD-10-CM | POA: Diagnosis not present

## 2016-05-17 LAB — BASIC METABOLIC PANEL
Anion gap: 7 (ref 5–15)
BUN: 6 mg/dL (ref 6–20)
CHLORIDE: 106 mmol/L (ref 101–111)
CO2: 27 mmol/L (ref 22–32)
Calcium: 9.2 mg/dL (ref 8.9–10.3)
Creatinine, Ser: 0.91 mg/dL (ref 0.44–1.00)
GFR calc Af Amer: >60 mL/min (ref >60)
Glucose, Bld: 124 mg/dL — ABNORMAL HIGH (ref 65–99)
POTASSIUM: 4.3 mmol/L (ref 3.5–5.1)
SODIUM: 140 mmol/L (ref 135–145)

## 2016-05-17 MED ORDER — CALCIUM CARBONATE ANTACID 500 MG PO CHEW: 2.0000 | CHEWABLE_TABLET | Freq: Two times a day (BID) | ORAL | 1 refills | Status: AC

## 2016-05-17 MED ORDER — LEVOTHYROXINE SODIUM 88 MCG PO TABS: 88.0000 ug | ORAL_TABLET | Freq: Every day | ORAL | 2 refills | Status: DC

## 2016-05-17 MED ORDER — OXYCODONE HCL 5 MG PO TABS: 5.0000 mg | ORAL_TABLET | ORAL | 0 refills | Status: DC | PRN

## 2016-05-17 NOTE — Progress Notes (Signed)
Discharge home. Home discharge instruction given, no question verbalized. 

## 2016-05-17 NOTE — Discharge Summary (Signed)
Physician Discharge Summary [facility]  Patient ID: Kelly Dennis MRN: [id number] DOB/AGE: 1945-04-09 71 y.o.  Admit date: 05/16/2016 Discharge date: 05/17/2016  Admission Diagnoses:  Thyroid neoplasm of uncertain behavior  Discharge Diagnoses:  Principal Problem:   Neoplasm of uncertain behavior of thyroid gland   Discharged Condition: good  Hospital Course: Patient was admitted for observation following thyroid surgery.  Post op course was uncomplicated.  Pain was well controlled.  Tolerated diet.  Post op calcium level on morning following surgery was 9.2 mg/dl.  Patient was prepared for discharge home on POD#1.  Consults: None  Treatments: surgery: total thyroidectomy  Discharge Exam: Blood pressure (!) 108/56, pulse 76, temperature 97.8 F (36.6 C), temperature source Oral, resp. rate 18, height 5\' 6"  (1.676 m), weight 65.8 kg (145 lb), SpO2 96 %. HEENT - clear Neck - wound dry and intact; minimal STS; voice normal Chest - clear bilaterally Cor - RRR  Disposition: Home  Discharge Instructions    Apply dressing    Complete by:  As directed    Apply light gauze dressing to wound before discharge home today.   Diet - low sodium heart healthy    Complete by:  As directed    Discharge instructions    Complete by:  As directed    [facility]  THYROID & PARATHYROID SURGERY:  POST-OP INSTRUCTIONS  Always review your discharge instruction sheet from the facility where your surgery was performed.  A prescription for pain medication may be given to you upon discharge.  Take your pain medication as prescribed.  If narcotic pain medicine is not needed, then you may take acetaminophen (Tylenol) or ibuprofen (Advil) as needed.  Take your usually prescribed medications unless otherwise directed.  If you need a refill on your pain medication, please contact our office during regular business hours.  Prescriptions will not be processed by  our office after 5 pm or on weekends.  Start with a light diet upon arrival home, such as soup and crackers or toast.  Be sure to drink plenty of fluids daily.  Resume your normal diet the day after surgery.  Most patients will experience some swelling and bruising on the chest and neck area.  Ice packs will help.  Swelling and bruising can take several days to resolve.   It is common to experience some constipation after surgery.  Increasing fluid intake and taking a stool softener (Colace) will usually help or prevent this problem.  A mild laxative (Milk of Magnesia or Miralax) should be taken according to package directions if there has been no bowel movement after 48 hours.  You have steri-strips and a gauze dressing over your incision.  You may remove the gauze bandage on the second day after surgery, and you may shower at that time.  Leave your steri-strips (small skin tapes) in place directly over the incision.  These strips should remain on the skin for 5-7 days and then be removed.  You may get them wet in the shower and pat them dry.  You may resume regular (light) daily activities beginning the next day - such as daily self-care, walking, climbing stairs - gradually increasing activities as tolerated.  You may have sexual intercourse when it is comfortable.  Refrain from any heavy lifting or straining until approved by your doctor.  You may drive when you no longer are taking prescription pain medication, you can comfortably wear a seatbelt, and you can safely maneuver your  car and apply brakes.  You should see your doctor in the office for a follow-up appointment approximately three weeks after your surgery.  Make sure that you call for this appointment within a day or two after you arrive home to insure a convenient appointment time.  WHEN TO CALL YOUR DOCTOR: -- Fever greater than 101.5 -- Inability to urinate -- Nausea and/or vomiting - persistent -- Extreme swelling or bruising --  Continued bleeding from incision -- Increased pain, redness, or drainage from the incision -- Difficulty swallowing or breathing -- Muscle cramping or spasms -- Numbness or tingling in hands or around lips  The clinic staff is available to answer your questions during regular business hours.  Please don't hesitate to call and ask to speak to one of the nurses if you have concerns.  Earnstine Regal, MD, [facility] Office: [phone]  Website: [email or web address]   Increase activity slowly    Complete by:  As directed    Remove dressing in 24 hours    Complete by:  As directed      Allergies as of 05/17/2016      Reactions   Gluten Meal Diarrhea   Iodinated Diagnostic Agents Other (See Comments)   Other Other (See Comments)   Latex   Shellfish Allergy Other (See Comments)   Sulfa Antibiotics Rash      Medication List    TAKE these medications   ALPRAZolam 0.25 MG tablet Commonly known as:  XANAX Take 0.25 mg by mouth 2 (two) times daily as needed for anxiety.   buPROPion 150 MG 24 hr tablet Commonly known as:  WELLBUTRIN XL Take 150 mg by mouth daily.   calcium carbonate 500 MG chewable tablet Commonly known as:  TUMS Chew 2 tablets (400 mg of elemental calcium total) by mouth 2 (two) times daily.   clonazePAM 0.5 MG tablet Commonly known as:  KLONOPIN Take 0.5 mg by mouth at bedtime.   levothyroxine 88 MCG tablet Commonly known as:  SYNTHROID Take 1 tablet (88 mcg total) by mouth daily before breakfast.   oxyCODONE 5 MG immediate release tablet Commonly known as:  Oxy IR/ROXICODONE Take 1-2 tablets (5-10 mg total) by mouth every 4 (four) hours as needed for moderate pain.   topiramate 25 MG tablet Commonly known as:  TOPAMAX Take 75 mg by mouth at bedtime.   venlafaxine XR 150 MG 24 hr capsule Commonly known as:  EFFEXOR-XR Take 450 mg by mouth daily with breakfast.   Vitamin D  (Ergocalciferol) 50000 units Caps capsule Commonly known as:  DRISDOL Take 50,000 Units by mouth every 7 (seven) days. Wednesdays   zolpidem 12.5 MG CR tablet Commonly known as:  AMBIEN CR Take 12.5 mg by mouth at bedtime.      Follow-up Information    Jenice Leiner M, MD. Schedule an appointment as soon as possible for a visit in 3 week(s).   Specialty:  General Surgery Contact information: [address] [place] [address] [phone]        KERR,JEFFREY, MD. Schedule an appointment as soon as possible for a visit in 3 week(s).   Specialty:  Endocrinology Contact information: [address] [place] [address] [phone]           Erik Nessel M. Tarena Gockley, MD, [facility] Office: [phone]   Signed: Earnstine Regal 05/17/2016, 7:39 AM

## 2016-06-07 ENCOUNTER — Other Ambulatory Visit (HOSPITAL_COMMUNITY): Payer: Self-pay | Admitting: Internal Medicine

## 2016-06-07 DIAGNOSIS — C73 Malignant neoplasm of thyroid gland: Secondary | ICD-10-CM

## 2016-06-14 ENCOUNTER — Other Ambulatory Visit: Payer: Self-pay | Admitting: Internal Medicine

## 2016-06-14 DIAGNOSIS — C73 Malignant neoplasm of thyroid gland: Secondary | ICD-10-CM

## 2016-06-20 ENCOUNTER — Ambulatory Visit
Admission: RE | Admit: 2016-06-20 | Discharge: 2016-06-20 | Disposition: A | Discharge status: Home / Self Care | Payer: Medicare Other | Source: Ambulatory Visit | Attending: Internal Medicine | Admitting: Internal Medicine

## 2016-06-20 DIAGNOSIS — C73 Malignant neoplasm of thyroid gland: Secondary | ICD-10-CM

## 2016-06-28 ENCOUNTER — Ambulatory Visit (HOSPITAL_COMMUNITY): Payer: Medicare Other

## 2016-06-28 ENCOUNTER — Other Ambulatory Visit (HOSPITAL_COMMUNITY): Payer: Medicare Other

## 2016-07-24 ENCOUNTER — Other Ambulatory Visit (HOSPITAL_COMMUNITY): Payer: Self-pay | Admitting: Endocrinology

## 2016-07-24 DIAGNOSIS — C73 Malignant neoplasm of thyroid gland: Secondary | ICD-10-CM

## 2016-07-31 ENCOUNTER — Other Ambulatory Visit (HOSPITAL_COMMUNITY): Payer: Self-pay | Admitting: Internal Medicine

## 2016-07-31 DIAGNOSIS — C73 Malignant neoplasm of thyroid gland: Secondary | ICD-10-CM

## 2016-08-02 ENCOUNTER — Encounter (HOSPITAL_COMMUNITY)
Admission: RE | Admit: 2016-08-02 | Discharge: 2016-08-02 | Disposition: A | Discharge status: Home / Self Care | Payer: Medicare Other | Source: Ambulatory Visit | Attending: Endocrinology | Admitting: Endocrinology

## 2016-08-02 ENCOUNTER — Encounter (HOSPITAL_COMMUNITY)
Admission: RE | Admit: 2016-08-02 | Discharge: 2016-08-02 | Disposition: A | Discharge status: Home / Self Care | Payer: Medicare Other | Source: Ambulatory Visit | Attending: Internal Medicine | Admitting: Internal Medicine

## 2016-08-02 DIAGNOSIS — C73 Malignant neoplasm of thyroid gland: Secondary | ICD-10-CM

## 2016-08-02 MED ORDER — THYROTROPIN ALFA 1.1 MG IM SOLR
0.9000 mg | INTRAMUSCULAR | Status: AC
  Administered 2016-08-02: 0.9 mg via INTRAMUSCULAR

## 2016-08-02 MED ORDER — STERILE WATER FOR INJECTION IJ SOLN
INTRAMUSCULAR | Status: AC
  Filled 2016-08-02: qty 10

## 2016-08-02 MED ORDER — TECHNETIUM TC 99M MEDRONATE IV KIT
25.0000 | PACK | Freq: Once | INTRAVENOUS | Status: AC | PRN
  Administered 2016-08-02: 18.6 via INTRAVENOUS

## 2016-08-03 ENCOUNTER — Encounter (HOSPITAL_COMMUNITY)
Admission: RE | Admit: 2016-08-03 | Discharge: 2016-08-03 | Disposition: A | Discharge status: Home / Self Care | Payer: Medicare Other | Source: Ambulatory Visit | Attending: Internal Medicine | Admitting: Internal Medicine

## 2016-08-03 ENCOUNTER — Encounter (HOSPITAL_COMMUNITY): Payer: Medicare Other

## 2016-08-03 DIAGNOSIS — C73 Malignant neoplasm of thyroid gland: Secondary | ICD-10-CM | POA: Insufficient documentation

## 2016-08-03 MED ORDER — THYROTROPIN ALFA 1.1 MG IM SOLR
0.9000 mg | INTRAMUSCULAR | Status: AC
  Administered 2016-08-03: 0.9 mg via INTRAMUSCULAR

## 2016-08-03 MED ORDER — STERILE WATER FOR INJECTION IJ SOLN
INTRAMUSCULAR | Status: AC
  Filled 2016-08-03: qty 10

## 2016-08-04 ENCOUNTER — Ambulatory Visit (HOSPITAL_COMMUNITY): Payer: Medicare Other

## 2016-08-04 ENCOUNTER — Encounter (HOSPITAL_COMMUNITY)
Admission: RE | Admit: 2016-08-04 | Discharge: 2016-08-04 | Disposition: A | Discharge status: Home / Self Care | Payer: Medicare Other | Source: Ambulatory Visit | Attending: Internal Medicine | Admitting: Internal Medicine

## 2016-08-04 DIAGNOSIS — C73 Malignant neoplasm of thyroid gland: Secondary | ICD-10-CM | POA: Diagnosis not present

## 2016-08-04 MED ORDER — SODIUM IODIDE I 131 CAPSULE
69.7000 | Freq: Once | INTRAVENOUS | Status: AC | PRN
  Administered 2016-08-04: 69.7 via ORAL

## 2016-08-05 ENCOUNTER — Ambulatory Visit (HOSPITAL_COMMUNITY): Payer: Medicare Other

## 2016-08-08 ENCOUNTER — Other Ambulatory Visit: Payer: Self-pay | Admitting: Endocrinology

## 2016-08-08 ENCOUNTER — Telehealth: Payer: Self-pay

## 2016-08-08 DIAGNOSIS — C73 Malignant neoplasm of thyroid gland: Secondary | ICD-10-CM

## 2016-08-08 NOTE — Telephone Encounter (Signed)
Spoke with patient to let her know her 13-hour prep was called to her [employer] in [place].  She understands to take Prednisone 50mg  PO 08/10/16 @2330 , 08/11/16 @ 0530 and 08/11/16 @ 1130.  She knows to take Benadryl 50mg  PO 08/11/16 @ 1130, too.  Brita Romp, RN

## 2016-08-11 ENCOUNTER — Ambulatory Visit
Admission: RE | Admit: 2016-08-11 | Discharge: 2016-08-11 | Disposition: A | Discharge status: Home / Self Care | Payer: Medicare Other | Source: Ambulatory Visit | Attending: Endocrinology | Admitting: Endocrinology

## 2016-08-11 DIAGNOSIS — C73 Malignant neoplasm of thyroid gland: Secondary | ICD-10-CM

## 2016-08-11 MED ORDER — IOPAMIDOL (ISOVUE-300) INJECTION 61%
75.0000 mL | Freq: Once | INTRAVENOUS | Status: AC | PRN
  Administered 2016-08-11: 75 mL via INTRAVENOUS

## 2016-08-14 ENCOUNTER — Encounter (HOSPITAL_COMMUNITY)
Admission: RE | Admit: 2016-08-14 | Discharge: 2016-08-14 | Disposition: A | Discharge status: Home / Self Care | Payer: Medicare Other | Source: Ambulatory Visit | Attending: Internal Medicine | Admitting: Internal Medicine

## 2016-08-14 DIAGNOSIS — C73 Malignant neoplasm of thyroid gland: Secondary | ICD-10-CM | POA: Insufficient documentation

## 2016-10-10 ENCOUNTER — Other Ambulatory Visit: Payer: Self-pay | Admitting: Family Medicine

## 2016-10-10 DIAGNOSIS — G459 Transient cerebral ischemic attack, unspecified: Secondary | ICD-10-CM

## 2016-10-11 ENCOUNTER — Ambulatory Visit
Admission: RE | Admit: 2016-10-11 | Discharge: 2016-10-11 | Disposition: A | Discharge status: Home / Self Care | Payer: Medicare Other | Source: Ambulatory Visit | Attending: Family Medicine | Admitting: Family Medicine

## 2016-10-11 DIAGNOSIS — G459 Transient cerebral ischemic attack, unspecified: Secondary | ICD-10-CM

## 2016-10-12 ENCOUNTER — Other Ambulatory Visit: Payer: Self-pay

## 2016-10-12 ENCOUNTER — Ambulatory Visit (HOSPITAL_COMMUNITY): Payer: Medicare Other | Attending: Cardiovascular Disease

## 2016-10-12 DIAGNOSIS — G459 Transient cerebral ischemic attack, unspecified: Secondary | ICD-10-CM

## 2016-10-21 ENCOUNTER — Ambulatory Visit
Admission: RE | Admit: 2016-10-21 | Discharge: 2016-10-21 | Disposition: A | Discharge status: Home / Self Care | Payer: Medicare Other | Source: Ambulatory Visit | Attending: Family Medicine | Admitting: Family Medicine

## 2016-10-21 DIAGNOSIS — G459 Transient cerebral ischemic attack, unspecified: Secondary | ICD-10-CM

## 2016-12-11 ENCOUNTER — Encounter: Payer: Self-pay | Admitting: Neurology

## 2016-12-11 ENCOUNTER — Ambulatory Visit (INDEPENDENT_AMBULATORY_CARE_PROVIDER_SITE_OTHER): Payer: Medicare Other | Admitting: Neurology

## 2016-12-11 VITALS — BP 149/89 | HR 82 | Ht 66.0 in | Wt 159.2 lb

## 2016-12-11 DIAGNOSIS — M6289 Other specified disorders of muscle: Secondary | ICD-10-CM | POA: Diagnosis not present

## 2016-12-11 DIAGNOSIS — R410 Disorientation, unspecified: Secondary | ICD-10-CM | POA: Insufficient documentation

## 2016-12-11 DIAGNOSIS — R531 Weakness: Secondary | ICD-10-CM
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
## 2016-12-25 ENCOUNTER — Ambulatory Visit (INDEPENDENT_AMBULATORY_CARE_PROVIDER_SITE_OTHER): Payer: Medicare Other | Admitting: Neurology

## 2016-12-25 DIAGNOSIS — R410 Disorientation, unspecified: Secondary | ICD-10-CM | POA: Diagnosis not present

## 2016-12-25 DIAGNOSIS — R531 Weakness: Secondary | ICD-10-CM

## 2016-12-27 NOTE — Procedures (Signed)
   HISTORY: 71 years old female, with history of TIA, transient left-sided weakness   Technical:  16 channel EEG was performed based on standard 10-16 international system. One channel was dedicated to EKG, which has demonstrates normal sinus rhythm of 84 beats per minutes.  Upon awakening, the posterior background activity was well-developed, in alpha range, it hurts, reactive to eye opening and closure.  There was no evidence of epileptiform discharge.  Photic stimulation was performed, which induced a symmetric photic driving.  Hyperventilation was performed, there was no abnormality elicit.  No sleep was achieved.  CONCLUSION: This is a  normal awake EEG.  There is no electrodiagnostic evidence of epileptiform discharge.  Marcial Pacas, M.D. Ph.D.  [facility] [place], [place] [address] Phone: [phone] Fax:      [phone]

## 2016-12-28 ENCOUNTER — Encounter: Payer: Self-pay | Admitting: Neurology

## 2018-05-25 IMAGING — CT CT CHEST W/O CM
3 of 4 series · 17 of 30 positions shown, 19 images · non-contrast
Comparison: 02/10/2016 plain film.  No prior CT.

CLINICAL DATA: Right upper lobe density on chest radiograph. Rule
out pneumonia. Diabetes. Hypertension.

EXAM:
CT CHEST WITHOUT CONTRAST
TECHNIQUE: Multidetector CT imaging of the chest was performed following the
standard protocol without IV contrast.

[Series 3: chest w/o · axial · non-contrast · 0.68mm/px · z∈[-222,-4]mm · 7 of 117 slices shown, 9 images]
[im 15/117  mediastinal]
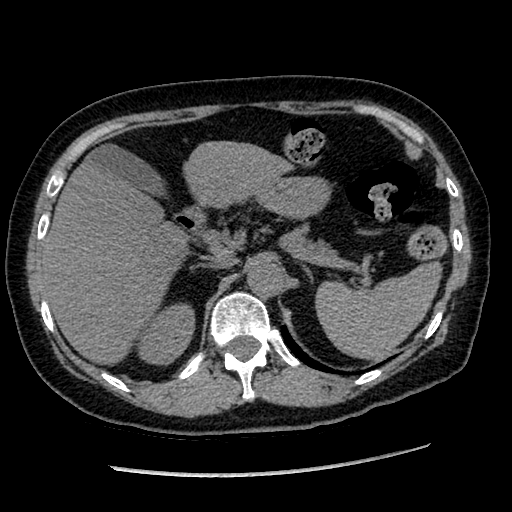
[im 15/117  lung]
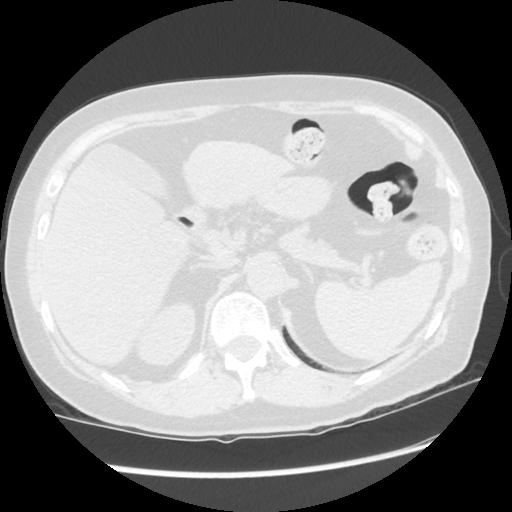
[im 30/117  lung]
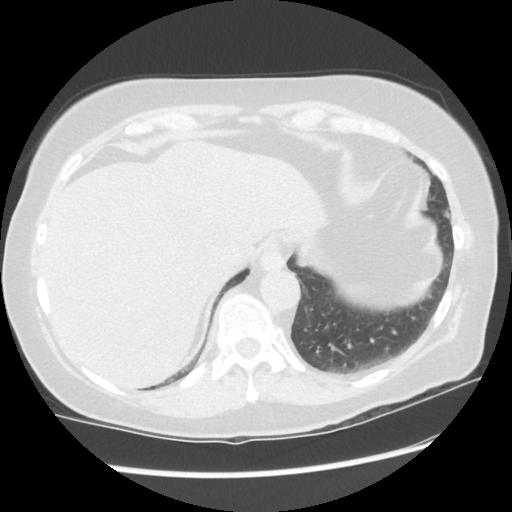
[im 44/117  lung]
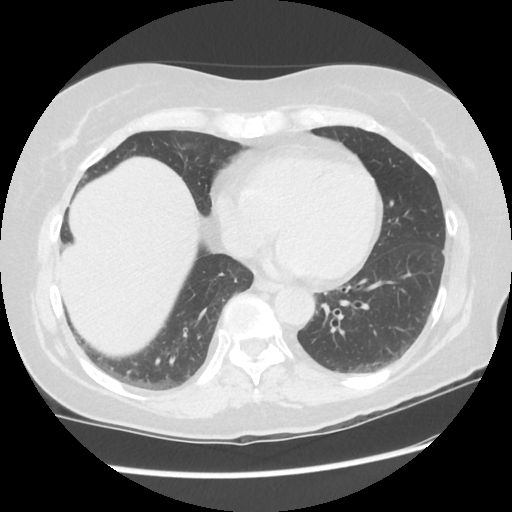
[im 59/117  lung]
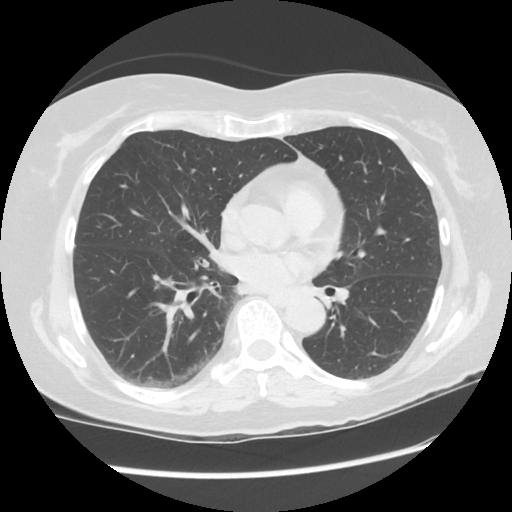
[im 73/117  mediastinal]
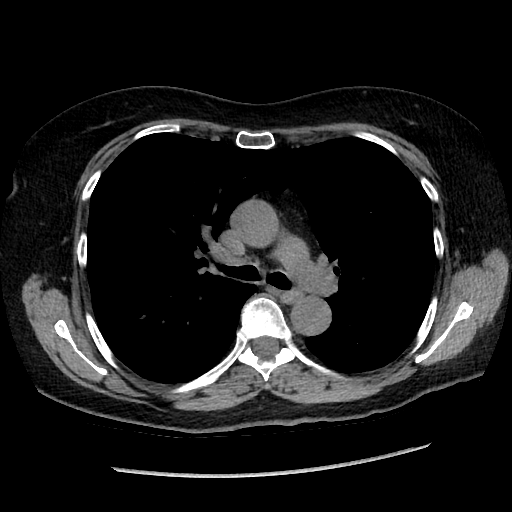
[im 73/117  lung]
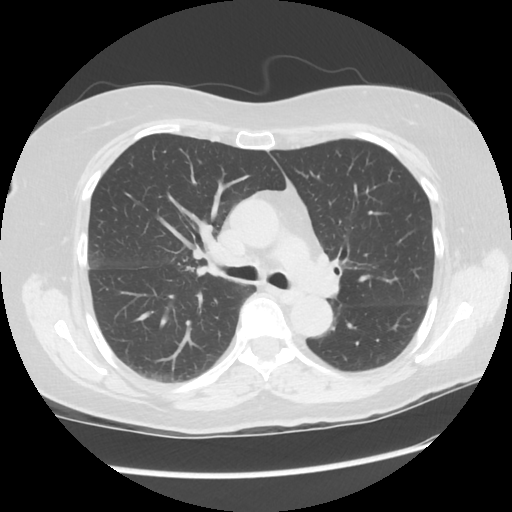
[im 88/117  lung]
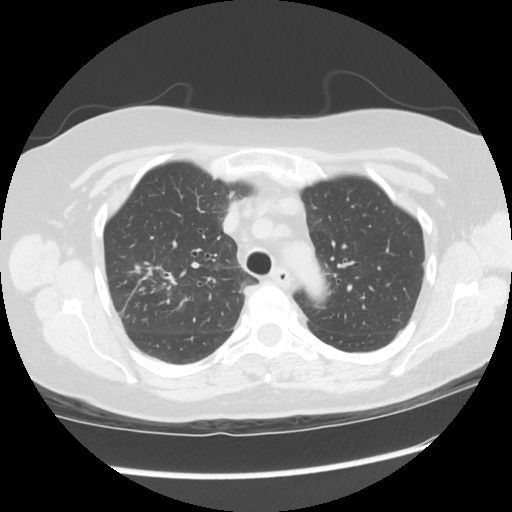
[im 102/117  lung]
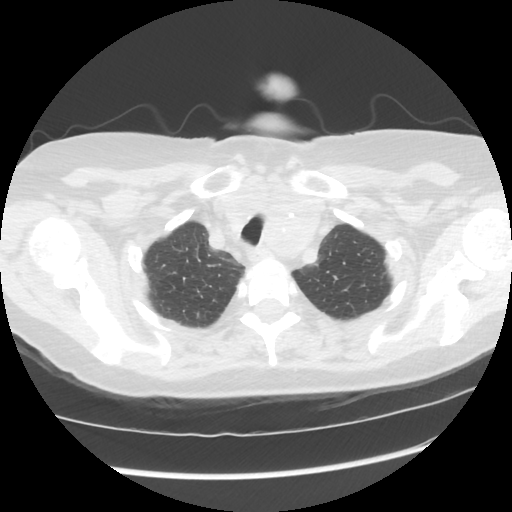

[Series 4: lung windows · axial · 0.68mm/px · z∈[-222,-4]mm · 7 of 117 slices shown]
[im 15/117  lung]
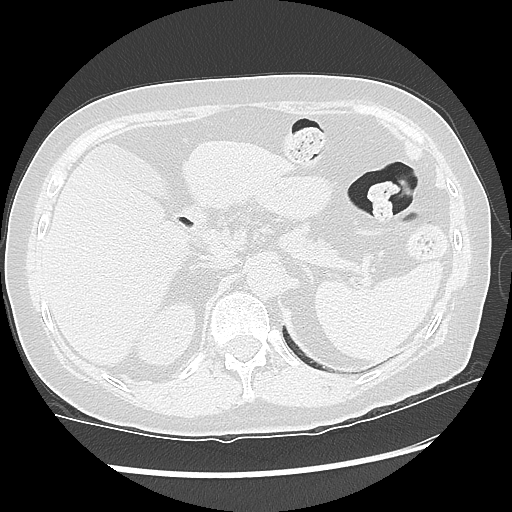
[im 30/117  lung]
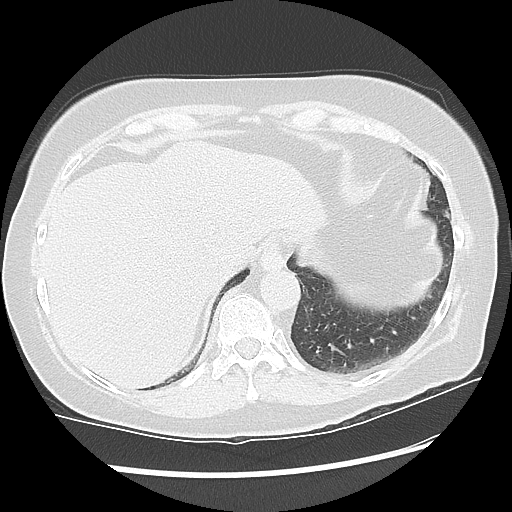
[im 44/117  lung]
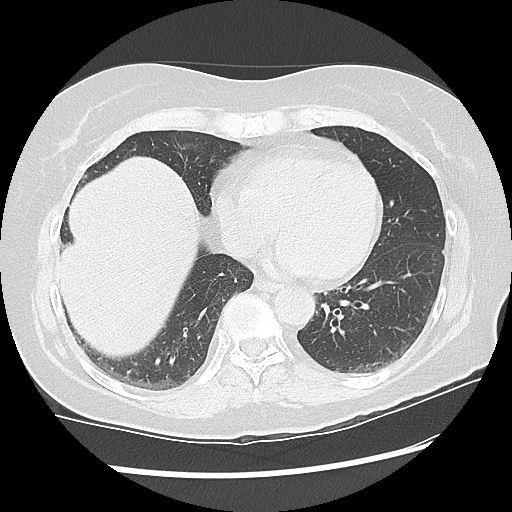
[im 59/117  lung]
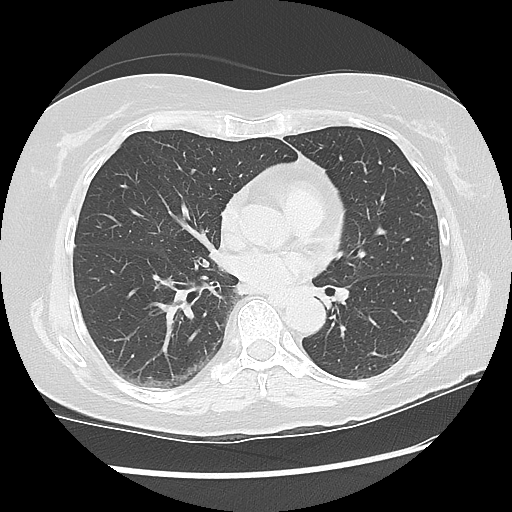
[im 73/117  lung]
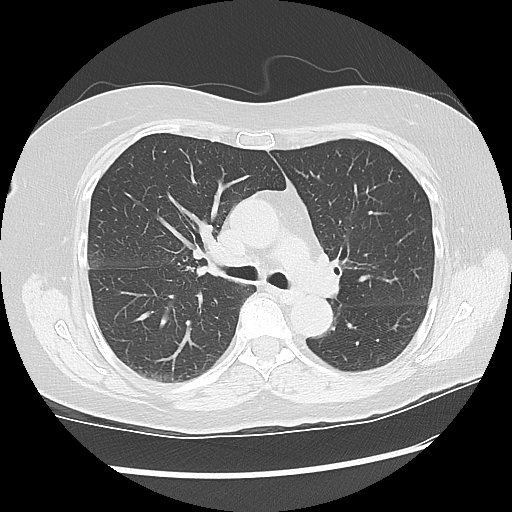
[im 88/117  lung]
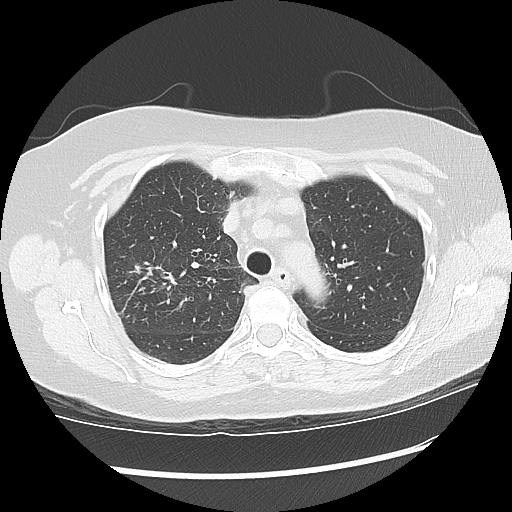
[im 102/117  lung]
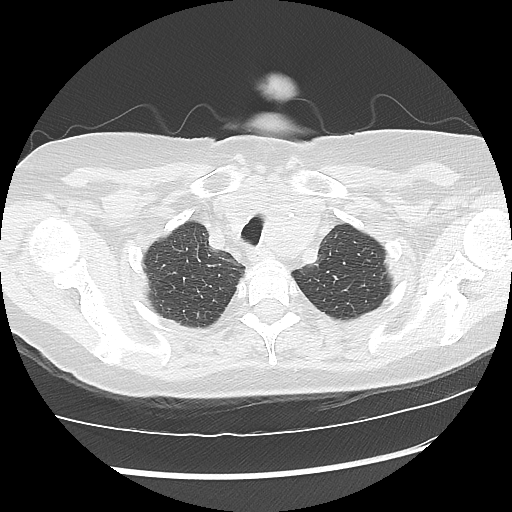

[Series 602: sagittal body · sagittal · 0.68mm/px · 3 of 140 slices shown]
[im 16/140  mediastinal]
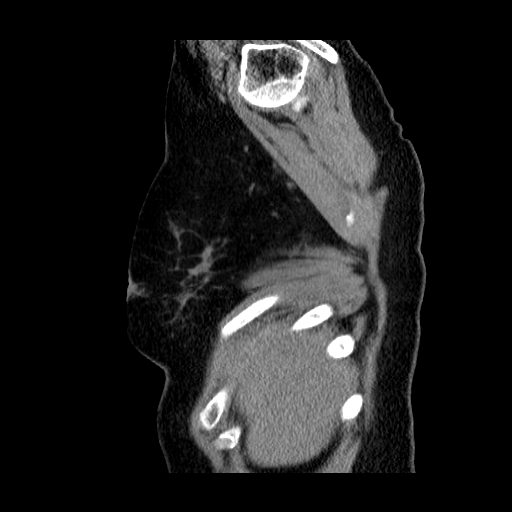
[im 31/140  mediastinal]
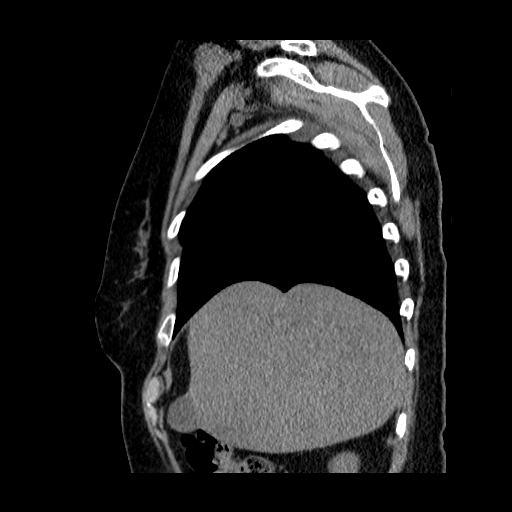
[im 47/140  mediastinal]
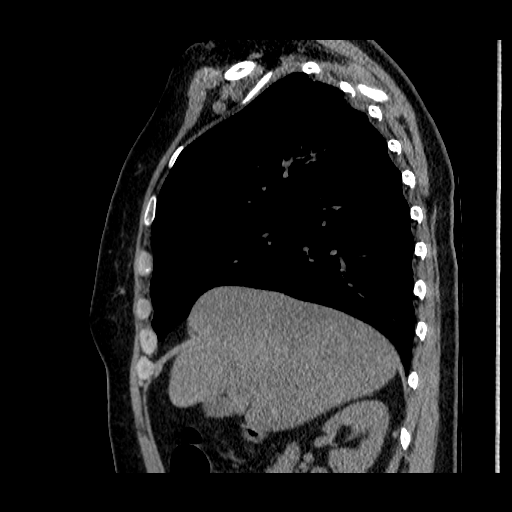

[17 of 30 positions shown; findings below may reference images not displayed]

FINDINGS: Cardiovascular: Tortuous thoracic aorta. Normal heart size, without
pericardial effusion.

Mediastinum/Nodes: Left-sided thyroid enlargement with extension
into the upper chest. No mediastinal or definite hilar adenopathy,
given limitations of unenhanced CT.

Lungs/Pleura: No pleural fluid. Corresponding to the plain film
abnormality, clustered peribronchovascular airspace and nodular
opacities within the right upper lobe. Example image 30/series 14.
Concurrent areas of mild mucoid impaction.

Upper Abdomen: Mild to moderate hepatic steatosis. Normal imaged
portions of the spleen, stomach, pancreas, adrenal glands, kidneys.

Musculoskeletal: No acute osseous abnormality.
IMPRESSION: 1. Right upper lobe abnormality corresponds to clustered
peribronchovascular airspace and nodular opacities with mucoid
impaction in the right upper lobe. Findings are post infectious or
inflammatory, and of indeterminate acuity.
2. Left-sided thyroid enlargement with extension into the upper
chest. Consider thyroid ultrasound if not already performed.
3. Hepatic steatosis.

## 2018-07-24 ENCOUNTER — Ambulatory Visit
Admission: RE | Admit: 2018-07-24 | Discharge: 2018-07-24 | Disposition: A | Discharge status: Home / Self Care | Payer: Medicare Other | Source: Ambulatory Visit | Attending: Family Medicine | Admitting: Family Medicine

## 2018-07-24 ENCOUNTER — Other Ambulatory Visit: Payer: Self-pay | Admitting: Family Medicine

## 2018-07-24 DIAGNOSIS — R509 Fever, unspecified: Secondary | ICD-10-CM

## 2018-07-31 ENCOUNTER — Other Ambulatory Visit: Payer: Self-pay | Admitting: Family Medicine

## 2018-08-07 ENCOUNTER — Other Ambulatory Visit: Payer: Self-pay | Admitting: Family Medicine

## 2018-08-09 ENCOUNTER — Other Ambulatory Visit: Payer: Self-pay | Admitting: Family Medicine

## 2018-08-09 DIAGNOSIS — R748 Abnormal levels of other serum enzymes: Secondary | ICD-10-CM

## 2018-08-26 ENCOUNTER — Ambulatory Visit
Admission: RE | Admit: 2018-08-26 | Discharge: 2018-08-26 | Disposition: A | Discharge status: Home / Self Care | Payer: Medicare Other | Source: Ambulatory Visit | Attending: Family Medicine | Admitting: Family Medicine

## 2018-08-26 DIAGNOSIS — R748 Abnormal levels of other serum enzymes: Secondary | ICD-10-CM

## 2018-08-26 MED ORDER — IOPAMIDOL (ISOVUE-300) INJECTION 61%
100.0000 mL | Freq: Once | INTRAVENOUS | Status: AC | PRN
  Administered 2018-08-26: 100 mL via INTRAVENOUS

## 2018-10-01 ENCOUNTER — Other Ambulatory Visit: Payer: Self-pay

## 2018-10-01 ENCOUNTER — Other Ambulatory Visit: Payer: Self-pay | Admitting: Family Medicine

## 2018-10-01 ENCOUNTER — Encounter (HOSPITAL_COMMUNITY)
Admission: RE | Admit: 2018-10-01 | Discharge: 2018-10-01 | Disposition: A | Discharge status: Home / Self Care | Payer: Medicare Other | Source: Ambulatory Visit | Attending: Family Medicine | Admitting: Family Medicine

## 2018-10-01 DIAGNOSIS — R0602 Shortness of breath: Secondary | ICD-10-CM

## 2018-10-01 MED ORDER — TECHNETIUM TO 99M ALBUMIN AGGREGATED
1.5000 | Freq: Once | INTRAVENOUS | Status: AC | PRN
  Administered 2018-10-01: 1.5 via INTRAVENOUS

## 2018-10-16 ENCOUNTER — Telehealth: Payer: Self-pay | Admitting: *Deleted

## 2018-10-16 NOTE — Telephone Encounter (Signed)
A message was left, re: scheduling change. 

## 2018-10-24 ENCOUNTER — Ambulatory Visit: Payer: Medicare Other | Admitting: Internal Medicine
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
## 2018-10-29 ENCOUNTER — Ambulatory Visit (INDEPENDENT_AMBULATORY_CARE_PROVIDER_SITE_OTHER): Payer: Medicare Other | Admitting: Cardiovascular Disease

## 2018-10-29 ENCOUNTER — Other Ambulatory Visit: Payer: Self-pay

## 2018-10-29 ENCOUNTER — Encounter: Payer: Self-pay | Admitting: Cardiovascular Disease

## 2018-10-29 VITALS — BP 122/84 | HR 112 | Temp 97.5°F | Ht 66.0 in | Wt 174.4 lb

## 2018-10-29 DIAGNOSIS — R0602 Shortness of breath: Secondary | ICD-10-CM

## 2018-10-29 DIAGNOSIS — R000 Tachycardia, unspecified: Secondary | ICD-10-CM

## 2018-10-29 DIAGNOSIS — F32A Depression, unspecified: Secondary | ICD-10-CM

## 2018-10-29 DIAGNOSIS — F329 Major depressive disorder, single episode, unspecified: Secondary | ICD-10-CM | POA: Insufficient documentation

## 2018-10-29 DIAGNOSIS — I10 Essential (primary) hypertension: Secondary | ICD-10-CM | POA: Diagnosis not present

## 2018-10-29 HISTORY — DX: Major depressive disorder, single episode, unspecified: F32.9

## 2018-10-29 HISTORY — DX: Depression, unspecified: F32.A

## 2018-10-29 NOTE — Patient Instructions (Signed)
Medication Instructions:  The current medical regimen is effective;  continue present plan and medications.  If you need a refill on your cardiac medications before your next appointment, please call your pharmacy.   Testing/Procedures: Echocardiogram - Your physician has requested that you have an echocardiogram. Echocardiography is a painless test that uses sound waves to create images of your heart. It provides your doctor with information about the size and shape of your heart and how well your heart's chambers and valves are working. This procedure takes approximately one hour. There are no restrictions for this procedure. This will be performed at our [facility] location - [address].   Follow-Up: At [facility], you and your health needs are our priority.  As part of our continuing mission to provide you with exceptional heart care, we have created designated Provider Care Teams.  These Care Teams include your primary Cardiologist (physician) and Advanced Practice Providers (APPs -  Physician Assistants and Nurse Practitioners) who all work together to provide you with the care you need, when you need it. . Follow up with Dr.O'Neal in 3 months.

## 2018-10-31 NOTE — Addendum Note (Signed)
Addended by: Venetia Maxon on: 10/31/2018 05:22 PM   Modules accepted: Orders

## 2018-11-12 ENCOUNTER — Ambulatory Visit (HOSPITAL_COMMUNITY): Payer: Medicare Other | Attending: Cardiovascular Disease

## 2018-11-12 ENCOUNTER — Other Ambulatory Visit: Payer: Self-pay

## 2018-11-12 ENCOUNTER — Telehealth: Payer: Self-pay | Admitting: *Deleted

## 2018-11-12 DIAGNOSIS — R000 Tachycardia, unspecified: Secondary | ICD-10-CM | POA: Diagnosis present

## 2018-11-12 DIAGNOSIS — R0602 Shortness of breath: Secondary | ICD-10-CM | POA: Diagnosis present

## 2018-11-12 DIAGNOSIS — I10 Essential (primary) hypertension: Secondary | ICD-10-CM

## 2018-11-12 NOTE — Telephone Encounter (Signed)
Released to mychart   called  To give results patient voicemail is full

## 2018-11-12 NOTE — Telephone Encounter (Signed)
-----   Message from Geralynn Rile, MD sent at 11/12/2018  2:53 PM EDT ----- Let Kelly Dennis know her echocardiogram is very normal.   Evalina Field, MD

## 2018-11-29 ENCOUNTER — Encounter: Payer: Self-pay | Admitting: *Deleted

## 2019-01-06 NOTE — Telephone Encounter (Signed)
Letter mailed to patient.

## 2019-01-29 ENCOUNTER — Ambulatory Visit: Payer: Medicare Other | Admitting: Cardiovascular Disease

## 2019-05-26 ENCOUNTER — Other Ambulatory Visit: Payer: Self-pay | Admitting: Family Medicine

## 2019-05-26 DIAGNOSIS — M810 Age-related osteoporosis without current pathological fracture: Secondary | ICD-10-CM

## 2019-05-28 ENCOUNTER — Emergency Department (HOSPITAL_COMMUNITY): Payer: Medicare Other

## 2019-05-28 ENCOUNTER — Other Ambulatory Visit: Payer: Self-pay

## 2019-05-28 ENCOUNTER — Other Ambulatory Visit: Payer: Medicare Other

## 2019-05-28 ENCOUNTER — Emergency Department (HOSPITAL_COMMUNITY)
Admission: EM | Admit: 2019-05-28 | Discharge: 2019-05-28 | Disposition: A | Discharge status: Home / Self Care | Payer: Medicare Other | Attending: Emergency Medicine | Admitting: Emergency Medicine

## 2019-05-28 DIAGNOSIS — Z91040 Latex allergy status: Secondary | ICD-10-CM | POA: Insufficient documentation

## 2019-05-28 DIAGNOSIS — Z79899 Other long term (current) drug therapy: Secondary | ICD-10-CM | POA: Insufficient documentation

## 2019-05-28 DIAGNOSIS — I10 Essential (primary) hypertension: Secondary | ICD-10-CM | POA: Insufficient documentation

## 2019-05-28 DIAGNOSIS — R1031 Right lower quadrant pain: Secondary | ICD-10-CM | POA: Diagnosis present

## 2019-05-28 DIAGNOSIS — Z85850 Personal history of malignant neoplasm of thyroid: Secondary | ICD-10-CM | POA: Diagnosis not present

## 2019-05-28 DIAGNOSIS — N200 Calculus of kidney: Secondary | ICD-10-CM | POA: Diagnosis not present

## 2019-05-28 DIAGNOSIS — Z8673 Personal history of transient ischemic attack (TIA), and cerebral infarction without residual deficits: Secondary | ICD-10-CM | POA: Insufficient documentation

## 2019-05-28 LAB — CBC
HCT: 48.3 % — ABNORMAL HIGH (ref 36.0–46.0)
Hemoglobin: 16.5 g/dL — ABNORMAL HIGH (ref 12.0–15.0)
MCH: 30.3 pg (ref 26.0–34.0)
MCHC: 34.2 g/dL (ref 30.0–36.0)
MCV: 88.8 fL (ref 80.0–100.0)
Platelets: 303 10*3/uL (ref 150–400)
RBC: 5.44 MIL/uL — ABNORMAL HIGH (ref 3.87–5.11)
RDW: 13.6 % (ref 11.5–15.5)
WBC: 13.8 10*3/uL — ABNORMAL HIGH (ref 4.0–10.5)
nRBC: 0 % (ref 0.0–0.2)

## 2019-05-28 LAB — URINALYSIS, ROUTINE W REFLEX MICROSCOPIC
Bacteria, UA: NONE SEEN
Bilirubin Urine: NEGATIVE
Glucose, UA: 150 mg/dL — AB
Ketones, ur: 5 mg/dL — AB
Leukocytes,Ua: NEGATIVE
Nitrite: NEGATIVE
Protein, ur: NEGATIVE mg/dL
RBC / HPF: >50 RBC/hpf — ABNORMAL HIGH (ref 0–5)
Specific Gravity, Urine: 1.015 (ref 1.005–1.030)
pH: 6 (ref 5.0–8.0)

## 2019-05-28 LAB — COMPREHENSIVE METABOLIC PANEL
ALT: 59 U/L — ABNORMAL HIGH (ref 0–44)
AST: 47 U/L — ABNORMAL HIGH (ref 15–41)
Albumin: 4.1 g/dL (ref 3.5–5.0)
Alkaline Phosphatase: 134 U/L — ABNORMAL HIGH (ref 38–126)
Anion gap: 17 — ABNORMAL HIGH (ref 5–15)
BUN: 19 mg/dL (ref 8–23)
CO2: 18 mmol/L — ABNORMAL LOW (ref 22–32)
Calcium: 9.2 mg/dL (ref 8.9–10.3)
Chloride: 106 mmol/L (ref 98–111)
Creatinine, Ser: 1.14 mg/dL — ABNORMAL HIGH (ref 0.44–1.00)
GFR calc Af Amer: 55 mL/min — ABNORMAL LOW (ref >60)
GFR calc non Af Amer: 48 mL/min — ABNORMAL LOW (ref >60)
Glucose, Bld: 220 mg/dL — ABNORMAL HIGH (ref 70–99)
Potassium: 3.6 mmol/L (ref 3.5–5.1)
Sodium: 141 mmol/L (ref 135–145)
Total Bilirubin: 0.8 mg/dL (ref 0.3–1.2)
Total Protein: 6.8 g/dL (ref 6.5–8.1)

## 2019-05-28 LAB — LIPASE, BLOOD: Lipase: 37 U/L (ref 11–51)

## 2019-05-28 MED ORDER — TAMSULOSIN HCL 0.4 MG PO CAPS: 0.4000 mg | ORAL_CAPSULE | Freq: Every day | ORAL | 0 refills | Status: DC

## 2019-05-28 MED ORDER — MORPHINE SULFATE (PF) 4 MG/ML IV SOLN
4.0000 mg | Freq: Once | INTRAVENOUS | Status: AC
  Administered 2019-05-28: 4 mg via INTRAVENOUS
  Filled 2019-05-28: qty 1

## 2019-05-28 MED ORDER — SODIUM CHLORIDE 0.9 % IV BOLUS
1000.0000 mL | Freq: Once | INTRAVENOUS | Status: AC
  Administered 2019-05-28: 1000 mL via INTRAVENOUS

## 2019-05-28 MED ORDER — KETOROLAC TROMETHAMINE 15 MG/ML IJ SOLN
15.0000 mg | Freq: Once | INTRAMUSCULAR | Status: AC
  Administered 2019-05-28: 15 mg via INTRAVENOUS
  Filled 2019-05-28: qty 1

## 2019-05-28 MED ORDER — SODIUM CHLORIDE 0.9% FLUSH
3.0000 mL | Freq: Once | INTRAVENOUS | Status: AC
  Administered 2019-05-28: 3 mL via INTRAVENOUS

## 2019-05-28 MED ORDER — MORPHINE SULFATE 15 MG PO TABS: 7.5000 mg | ORAL_TABLET | ORAL | 0 refills | Status: AC | PRN

## 2019-05-28 MED ORDER — ONDANSETRON HCL 4 MG/2ML IJ SOLN
4.0000 mg | Freq: Once | INTRAMUSCULAR | Status: AC
  Administered 2019-05-28: 4 mg via INTRAVENOUS
  Filled 2019-05-28: qty 2

## 2019-05-28 MED ORDER — ONDANSETRON 4 MG PO TBDP: 4.0000 mg | ORAL_TABLET | Freq: Three times a day (TID) | ORAL | 0 refills | Status: AC | PRN

## 2019-05-28 NOTE — ED Provider Notes (Signed)
  Provider Note MRN:  [id number]  Arrival date & time: 05/28/19    ED Course and Medical Decision Making  Assumed care from Dr. Tyrone Nine at shift change.  Confirmed kidney stone with hydro, still needing symptom control, awaiting reassessment after second dose pain meds.  Upon reassessment patient is feeling much better, currently pain-free, appropriate for discharge with urology follow-up. Procedures  Final Clinical Impressions(s) / ED Diagnoses     ICD-10-CM   1. Nephrolithiasis  N20.0     ED Discharge Orders         Ordered    morphine (MSIR) 15 MG tablet  Every 4 hours PRN     05/28/19 1634    ondansetron (ZOFRAN ODT) 4 MG disintegrating tablet  Every 8 hours PRN     05/28/19 1634    tamsulosin (FLOMAX) 0.4 MG CAPS capsule  Daily after supper     05/28/19 1634            Discharge Instructions     Take 4 over the counter ibuprofen tablets 3 times a day or 2 over-the-counter naproxen tablets twice a day for pain. Also take tylenol 1000mg (2 extra strength) four times a day.   Then take the pain medicine if you feel like you need it. Narcotics do not help with the pain, they only make you care about it less.  You can become addicted to this, people may break into your house to steal it.  It will constipate you.  If you drive under the influence of this medicine you can get a DUI.       Barth Kirks. Sedonia Small, [place] [email or web address]    Maudie Flakes, MD 05/28/19 [phone]
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
## 2019-05-28 NOTE — ED Triage Notes (Signed)
Pt here for evaluation of R flank and RLQ pain with vomiting since this morning. Denies urinary problems but is actively vomiting in triage. Had similar episode of RLQ pain with radiation to groin on Monday night but took advil with complete relief.

## 2019-05-28 NOTE — Discharge Instructions (Signed)

## 2019-06-03 ENCOUNTER — Other Ambulatory Visit: Payer: Self-pay | Admitting: Family Medicine

## 2019-06-03 DIAGNOSIS — R7989 Other specified abnormal findings of blood chemistry: Secondary | ICD-10-CM

## 2019-06-19 ENCOUNTER — Other Ambulatory Visit: Payer: Self-pay | Admitting: Family Medicine

## 2019-06-19 DIAGNOSIS — Z1231 Encounter for screening mammogram for malignant neoplasm of breast: Secondary | ICD-10-CM

## 2019-06-24 ENCOUNTER — Other Ambulatory Visit: Payer: Self-pay

## 2019-06-24 ENCOUNTER — Ambulatory Visit
Admission: RE | Admit: 2019-06-24 | Discharge: 2019-06-24 | Disposition: A | Discharge status: Home / Self Care | Payer: Medicare Other | Source: Ambulatory Visit | Attending: Family Medicine | Admitting: Family Medicine

## 2019-06-24 DIAGNOSIS — R7989 Other specified abnormal findings of blood chemistry: Secondary | ICD-10-CM

## 2019-09-03 ENCOUNTER — Ambulatory Visit
Admission: RE | Admit: 2019-09-03 | Discharge: 2019-09-03 | Disposition: A | Discharge status: Home / Self Care | Payer: Medicare Other | Source: Ambulatory Visit | Attending: Family Medicine | Admitting: Family Medicine

## 2019-09-03 ENCOUNTER — Other Ambulatory Visit: Payer: Self-pay

## 2019-09-03 DIAGNOSIS — M810 Age-related osteoporosis without current pathological fracture: Secondary | ICD-10-CM

## 2019-09-03 DIAGNOSIS — Z1231 Encounter for screening mammogram for malignant neoplasm of breast: Secondary | ICD-10-CM

## 2020-03-24 DIAGNOSIS — E876 Hypokalemia: Secondary | ICD-10-CM | POA: Diagnosis not present

## 2020-03-24 DIAGNOSIS — K760 Fatty (change of) liver, not elsewhere classified: Secondary | ICD-10-CM | POA: Diagnosis not present

## 2020-04-29 DIAGNOSIS — E876 Hypokalemia: Secondary | ICD-10-CM | POA: Diagnosis not present

## 2020-05-04 DIAGNOSIS — I679 Cerebrovascular disease, unspecified: Secondary | ICD-10-CM | POA: Diagnosis not present

## 2020-05-04 DIAGNOSIS — G459 Transient cerebral ischemic attack, unspecified: Secondary | ICD-10-CM | POA: Diagnosis not present

## 2020-05-04 DIAGNOSIS — R7309 Other abnormal glucose: Secondary | ICD-10-CM | POA: Diagnosis not present

## 2020-05-04 DIAGNOSIS — C73 Malignant neoplasm of thyroid gland: Secondary | ICD-10-CM | POA: Diagnosis not present

## 2020-05-04 DIAGNOSIS — E890 Postprocedural hypothyroidism: Secondary | ICD-10-CM | POA: Diagnosis not present

## 2020-05-04 DIAGNOSIS — Z13828 Encounter for screening for other musculoskeletal disorder: Secondary | ICD-10-CM | POA: Diagnosis not present

## 2020-05-04 DIAGNOSIS — E876 Hypokalemia: Secondary | ICD-10-CM | POA: Diagnosis not present

## 2020-05-04 DIAGNOSIS — K760 Fatty (change of) liver, not elsewhere classified: Secondary | ICD-10-CM | POA: Diagnosis not present

## 2020-05-04 DIAGNOSIS — I10 Essential (primary) hypertension: Secondary | ICD-10-CM | POA: Diagnosis not present

## 2020-05-04 DIAGNOSIS — M810 Age-related osteoporosis without current pathological fracture: Secondary | ICD-10-CM | POA: Diagnosis not present

## 2020-07-20 DIAGNOSIS — D72829 Elevated white blood cell count, unspecified: Secondary | ICD-10-CM | POA: Diagnosis not present

## 2020-07-20 DIAGNOSIS — E559 Vitamin D deficiency, unspecified: Secondary | ICD-10-CM | POA: Diagnosis not present

## 2020-07-20 DIAGNOSIS — Z136 Encounter for screening for cardiovascular disorders: Secondary | ICD-10-CM | POA: Diagnosis not present

## 2020-07-20 DIAGNOSIS — E1169 Type 2 diabetes mellitus with other specified complication: Secondary | ICD-10-CM | POA: Diagnosis not present

## 2020-07-20 DIAGNOSIS — E039 Hypothyroidism, unspecified: Secondary | ICD-10-CM | POA: Diagnosis not present

## 2020-07-22 DIAGNOSIS — Z0000 Encounter for general adult medical examination without abnormal findings: Secondary | ICD-10-CM | POA: Diagnosis not present

## 2020-07-22 DIAGNOSIS — I10 Essential (primary) hypertension: Secondary | ICD-10-CM | POA: Diagnosis not present

## 2020-07-22 DIAGNOSIS — J019 Acute sinusitis, unspecified: Secondary | ICD-10-CM | POA: Diagnosis not present

## 2020-07-22 DIAGNOSIS — E7800 Pure hypercholesterolemia, unspecified: Secondary | ICD-10-CM | POA: Diagnosis not present

## 2020-07-22 DIAGNOSIS — R945 Abnormal results of liver function studies: Secondary | ICD-10-CM | POA: Diagnosis not present

## 2020-07-22 DIAGNOSIS — G4700 Insomnia, unspecified: Secondary | ICD-10-CM | POA: Diagnosis not present

## 2020-07-22 DIAGNOSIS — E1169 Type 2 diabetes mellitus with other specified complication: Secondary | ICD-10-CM | POA: Diagnosis not present

## 2020-07-22 DIAGNOSIS — E039 Hypothyroidism, unspecified: Secondary | ICD-10-CM | POA: Diagnosis not present

## 2020-07-22 DIAGNOSIS — M810 Age-related osteoporosis without current pathological fracture: Secondary | ICD-10-CM | POA: Diagnosis not present

## 2020-07-22 DIAGNOSIS — N644 Mastodynia: Secondary | ICD-10-CM | POA: Diagnosis not present

## 2020-07-22 DIAGNOSIS — R5383 Other fatigue: Secondary | ICD-10-CM | POA: Diagnosis not present

## 2020-11-17 DIAGNOSIS — E1169 Type 2 diabetes mellitus with other specified complication: Secondary | ICD-10-CM | POA: Diagnosis not present

## 2020-11-17 DIAGNOSIS — E039 Hypothyroidism, unspecified: Secondary | ICD-10-CM | POA: Diagnosis not present

## 2020-11-17 DIAGNOSIS — E559 Vitamin D deficiency, unspecified: Secondary | ICD-10-CM | POA: Diagnosis not present

## 2020-11-17 DIAGNOSIS — D72829 Elevated white blood cell count, unspecified: Secondary | ICD-10-CM | POA: Diagnosis not present

## 2020-11-26 DIAGNOSIS — E1169 Type 2 diabetes mellitus with other specified complication: Secondary | ICD-10-CM | POA: Diagnosis not present

## 2020-11-26 DIAGNOSIS — E7800 Pure hypercholesterolemia, unspecified: Secondary | ICD-10-CM | POA: Diagnosis not present

## 2020-11-26 DIAGNOSIS — E039 Hypothyroidism, unspecified: Secondary | ICD-10-CM | POA: Diagnosis not present

## 2020-11-26 DIAGNOSIS — R5383 Other fatigue: Secondary | ICD-10-CM | POA: Diagnosis not present

## 2020-11-26 DIAGNOSIS — E559 Vitamin D deficiency, unspecified: Secondary | ICD-10-CM | POA: Diagnosis not present

## 2020-11-26 DIAGNOSIS — G4700 Insomnia, unspecified: Secondary | ICD-10-CM | POA: Diagnosis not present

## 2021-02-22 DIAGNOSIS — E1169 Type 2 diabetes mellitus with other specified complication: Secondary | ICD-10-CM | POA: Diagnosis not present

## 2021-04-06 DIAGNOSIS — E559 Vitamin D deficiency, unspecified: Secondary | ICD-10-CM | POA: Diagnosis not present

## 2021-04-06 DIAGNOSIS — R110 Nausea: Secondary | ICD-10-CM | POA: Diagnosis not present

## 2021-04-06 DIAGNOSIS — E039 Hypothyroidism, unspecified: Secondary | ICD-10-CM | POA: Diagnosis not present

## 2021-04-06 DIAGNOSIS — N183 Chronic kidney disease, stage 3 unspecified: Secondary | ICD-10-CM | POA: Diagnosis not present

## 2021-04-06 DIAGNOSIS — E1169 Type 2 diabetes mellitus with other specified complication: Secondary | ICD-10-CM | POA: Diagnosis not present

## 2021-04-06 DIAGNOSIS — I700 Atherosclerosis of aorta: Secondary | ICD-10-CM | POA: Diagnosis not present

## 2021-04-06 DIAGNOSIS — E7800 Pure hypercholesterolemia, unspecified: Secondary | ICD-10-CM | POA: Diagnosis not present

## 2021-04-06 DIAGNOSIS — I10 Essential (primary) hypertension: Secondary | ICD-10-CM | POA: Diagnosis not present

## 2021-04-06 DIAGNOSIS — E876 Hypokalemia: Secondary | ICD-10-CM | POA: Diagnosis not present

## 2021-05-05 DIAGNOSIS — K760 Fatty (change of) liver, not elsewhere classified: Secondary | ICD-10-CM | POA: Diagnosis not present

## 2021-05-05 DIAGNOSIS — N200 Calculus of kidney: Secondary | ICD-10-CM | POA: Diagnosis not present

## 2021-05-05 DIAGNOSIS — I679 Cerebrovascular disease, unspecified: Secondary | ICD-10-CM | POA: Diagnosis not present

## 2021-05-05 DIAGNOSIS — E876 Hypokalemia: Secondary | ICD-10-CM | POA: Diagnosis not present

## 2021-05-05 DIAGNOSIS — C73 Malignant neoplasm of thyroid gland: Secondary | ICD-10-CM | POA: Diagnosis not present

## 2021-05-05 DIAGNOSIS — M810 Age-related osteoporosis without current pathological fracture: Secondary | ICD-10-CM | POA: Diagnosis not present

## 2021-05-05 DIAGNOSIS — G459 Transient cerebral ischemic attack, unspecified: Secondary | ICD-10-CM | POA: Diagnosis not present

## 2021-05-05 DIAGNOSIS — I10 Essential (primary) hypertension: Secondary | ICD-10-CM | POA: Diagnosis not present

## 2021-05-25 DIAGNOSIS — E1169 Type 2 diabetes mellitus with other specified complication: Secondary | ICD-10-CM | POA: Diagnosis not present

## 2021-05-25 DIAGNOSIS — E039 Hypothyroidism, unspecified: Secondary | ICD-10-CM | POA: Diagnosis not present

## 2021-05-25 DIAGNOSIS — E7800 Pure hypercholesterolemia, unspecified: Secondary | ICD-10-CM | POA: Diagnosis not present

## 2021-06-02 DIAGNOSIS — L309 Dermatitis, unspecified: Secondary | ICD-10-CM | POA: Diagnosis not present

## 2021-06-02 DIAGNOSIS — E876 Hypokalemia: Secondary | ICD-10-CM | POA: Diagnosis not present

## 2021-06-02 DIAGNOSIS — E7800 Pure hypercholesterolemia, unspecified: Secondary | ICD-10-CM | POA: Diagnosis not present

## 2021-07-19 DIAGNOSIS — E7800 Pure hypercholesterolemia, unspecified: Secondary | ICD-10-CM | POA: Diagnosis not present

## 2021-07-19 DIAGNOSIS — E876 Hypokalemia: Secondary | ICD-10-CM | POA: Diagnosis not present

## 2021-07-21 DIAGNOSIS — R5383 Other fatigue: Secondary | ICD-10-CM | POA: Diagnosis not present

## 2021-07-21 DIAGNOSIS — E876 Hypokalemia: Secondary | ICD-10-CM | POA: Diagnosis not present

## 2021-07-21 DIAGNOSIS — I10 Essential (primary) hypertension: Secondary | ICD-10-CM | POA: Diagnosis not present

## 2021-07-21 DIAGNOSIS — E7800 Pure hypercholesterolemia, unspecified: Secondary | ICD-10-CM | POA: Diagnosis not present

## 2021-07-21 DIAGNOSIS — L309 Dermatitis, unspecified: Secondary | ICD-10-CM | POA: Diagnosis not present

## 2021-07-21 DIAGNOSIS — R945 Abnormal results of liver function studies: Secondary | ICD-10-CM | POA: Diagnosis not present

## 2021-07-21 DIAGNOSIS — M810 Age-related osteoporosis without current pathological fracture: Secondary | ICD-10-CM | POA: Diagnosis not present

## 2021-07-21 DIAGNOSIS — E039 Hypothyroidism, unspecified: Secondary | ICD-10-CM | POA: Diagnosis not present

## 2021-07-21 DIAGNOSIS — Z0000 Encounter for general adult medical examination without abnormal findings: Secondary | ICD-10-CM | POA: Diagnosis not present

## 2021-07-21 DIAGNOSIS — E1169 Type 2 diabetes mellitus with other specified complication: Secondary | ICD-10-CM | POA: Diagnosis not present

## 2021-07-21 DIAGNOSIS — N183 Chronic kidney disease, stage 3 unspecified: Secondary | ICD-10-CM | POA: Diagnosis not present

## 2021-08-11 DIAGNOSIS — R109 Unspecified abdominal pain: Secondary | ICD-10-CM | POA: Diagnosis not present

## 2021-08-22 DIAGNOSIS — E7800 Pure hypercholesterolemia, unspecified: Secondary | ICD-10-CM | POA: Diagnosis not present

## 2021-08-22 DIAGNOSIS — R945 Abnormal results of liver function studies: Secondary | ICD-10-CM | POA: Diagnosis not present

## 2021-08-22 DIAGNOSIS — E1169 Type 2 diabetes mellitus with other specified complication: Secondary | ICD-10-CM | POA: Diagnosis not present

## 2021-08-29 DIAGNOSIS — L0202 Furuncle of face: Secondary | ICD-10-CM | POA: Diagnosis not present

## 2021-08-29 DIAGNOSIS — L821 Other seborrheic keratosis: Secondary | ICD-10-CM | POA: Diagnosis not present

## 2021-08-29 DIAGNOSIS — N200 Calculus of kidney: Secondary | ICD-10-CM | POA: Diagnosis not present

## 2021-10-11 DIAGNOSIS — L820 Inflamed seborrheic keratosis: Secondary | ICD-10-CM | POA: Diagnosis not present

## 2021-10-11 DIAGNOSIS — L821 Other seborrheic keratosis: Secondary | ICD-10-CM | POA: Diagnosis not present

## 2021-10-11 DIAGNOSIS — L814 Other melanin hyperpigmentation: Secondary | ICD-10-CM | POA: Diagnosis not present

## 2021-10-11 DIAGNOSIS — D1801 Hemangioma of skin and subcutaneous tissue: Secondary | ICD-10-CM | POA: Diagnosis not present

## 2021-10-11 DIAGNOSIS — Z789 Other specified health status: Secondary | ICD-10-CM | POA: Diagnosis not present

## 2021-10-11 DIAGNOSIS — R208 Other disturbances of skin sensation: Secondary | ICD-10-CM | POA: Diagnosis not present

## 2021-10-11 DIAGNOSIS — L0202 Furuncle of face: Secondary | ICD-10-CM | POA: Diagnosis not present

## 2021-10-13 DIAGNOSIS — N200 Calculus of kidney: Secondary | ICD-10-CM | POA: Diagnosis not present

## 2021-10-14 DIAGNOSIS — N183 Chronic kidney disease, stage 3 unspecified: Secondary | ICD-10-CM | POA: Diagnosis not present

## 2021-10-14 DIAGNOSIS — I10 Essential (primary) hypertension: Secondary | ICD-10-CM | POA: Diagnosis not present

## 2021-10-14 DIAGNOSIS — E1169 Type 2 diabetes mellitus with other specified complication: Secondary | ICD-10-CM | POA: Diagnosis not present

## 2021-10-14 DIAGNOSIS — E890 Postprocedural hypothyroidism: Secondary | ICD-10-CM | POA: Diagnosis not present

## 2021-10-14 DIAGNOSIS — E7800 Pure hypercholesterolemia, unspecified: Secondary | ICD-10-CM | POA: Diagnosis not present

## 2021-11-16 DIAGNOSIS — E7800 Pure hypercholesterolemia, unspecified: Secondary | ICD-10-CM | POA: Diagnosis not present

## 2021-11-16 DIAGNOSIS — E1169 Type 2 diabetes mellitus with other specified complication: Secondary | ICD-10-CM | POA: Diagnosis not present

## 2021-11-22 DIAGNOSIS — E1169 Type 2 diabetes mellitus with other specified complication: Secondary | ICD-10-CM | POA: Diagnosis not present

## 2021-11-22 DIAGNOSIS — R109 Unspecified abdominal pain: Secondary | ICD-10-CM | POA: Diagnosis not present

## 2021-11-22 DIAGNOSIS — N200 Calculus of kidney: Secondary | ICD-10-CM | POA: Diagnosis not present

## 2021-11-22 DIAGNOSIS — E7800 Pure hypercholesterolemia, unspecified: Secondary | ICD-10-CM | POA: Diagnosis not present

## 2021-11-22 DIAGNOSIS — E039 Hypothyroidism, unspecified: Secondary | ICD-10-CM | POA: Diagnosis not present

## 2022-01-09 DIAGNOSIS — Z419 Encounter for procedure for purposes other than remedying health state, unspecified: Secondary | ICD-10-CM | POA: Diagnosis not present

## 2022-01-09 DIAGNOSIS — L821 Other seborrheic keratosis: Secondary | ICD-10-CM | POA: Diagnosis not present

## 2022-03-29 DIAGNOSIS — E7800 Pure hypercholesterolemia, unspecified: Secondary | ICD-10-CM | POA: Diagnosis not present

## 2022-03-29 DIAGNOSIS — E1169 Type 2 diabetes mellitus with other specified complication: Secondary | ICD-10-CM | POA: Diagnosis not present

## 2022-03-29 DIAGNOSIS — E559 Vitamin D deficiency, unspecified: Secondary | ICD-10-CM | POA: Diagnosis not present

## 2022-03-29 DIAGNOSIS — E039 Hypothyroidism, unspecified: Secondary | ICD-10-CM | POA: Diagnosis not present

## 2022-03-29 DIAGNOSIS — D72829 Elevated white blood cell count, unspecified: Secondary | ICD-10-CM | POA: Diagnosis not present

## 2022-03-31 DIAGNOSIS — D72829 Elevated white blood cell count, unspecified: Secondary | ICD-10-CM | POA: Diagnosis not present

## 2022-03-31 DIAGNOSIS — E876 Hypokalemia: Secondary | ICD-10-CM | POA: Diagnosis not present

## 2022-03-31 DIAGNOSIS — K760 Fatty (change of) liver, not elsewhere classified: Secondary | ICD-10-CM | POA: Diagnosis not present

## 2022-03-31 DIAGNOSIS — E890 Postprocedural hypothyroidism: Secondary | ICD-10-CM | POA: Diagnosis not present

## 2022-03-31 DIAGNOSIS — I700 Atherosclerosis of aorta: Secondary | ICD-10-CM | POA: Diagnosis not present

## 2022-03-31 DIAGNOSIS — N183 Chronic kidney disease, stage 3 unspecified: Secondary | ICD-10-CM | POA: Diagnosis not present

## 2022-03-31 DIAGNOSIS — E1169 Type 2 diabetes mellitus with other specified complication: Secondary | ICD-10-CM | POA: Diagnosis not present

## 2022-03-31 DIAGNOSIS — I10 Essential (primary) hypertension: Secondary | ICD-10-CM | POA: Diagnosis not present

## 2022-03-31 DIAGNOSIS — D72820 Lymphocytosis (symptomatic): Secondary | ICD-10-CM | POA: Diagnosis not present

## 2022-04-14 DIAGNOSIS — E876 Hypokalemia: Secondary | ICD-10-CM | POA: Diagnosis not present

## 2022-04-14 DIAGNOSIS — D72829 Elevated white blood cell count, unspecified: Secondary | ICD-10-CM | POA: Diagnosis not present

## 2022-04-14 DIAGNOSIS — K760 Fatty (change of) liver, not elsewhere classified: Secondary | ICD-10-CM | POA: Diagnosis not present

## 2022-04-14 DIAGNOSIS — I10 Essential (primary) hypertension: Secondary | ICD-10-CM | POA: Diagnosis not present

## 2022-04-21 DIAGNOSIS — G459 Transient cerebral ischemic attack, unspecified: Secondary | ICD-10-CM | POA: Diagnosis not present

## 2022-04-21 DIAGNOSIS — C73 Malignant neoplasm of thyroid gland: Secondary | ICD-10-CM | POA: Diagnosis not present

## 2022-04-21 DIAGNOSIS — N200 Calculus of kidney: Secondary | ICD-10-CM | POA: Diagnosis not present

## 2022-04-21 DIAGNOSIS — I679 Cerebrovascular disease, unspecified: Secondary | ICD-10-CM | POA: Diagnosis not present

## 2022-04-21 DIAGNOSIS — E119 Type 2 diabetes mellitus without complications: Secondary | ICD-10-CM | POA: Diagnosis not present

## 2022-04-21 DIAGNOSIS — I700 Atherosclerosis of aorta: Secondary | ICD-10-CM | POA: Diagnosis not present

## 2022-04-21 DIAGNOSIS — K760 Fatty (change of) liver, not elsewhere classified: Secondary | ICD-10-CM | POA: Diagnosis not present

## 2022-04-21 DIAGNOSIS — I6523 Occlusion and stenosis of bilateral carotid arteries: Secondary | ICD-10-CM | POA: Diagnosis not present

## 2022-04-21 DIAGNOSIS — M810 Age-related osteoporosis without current pathological fracture: Secondary | ICD-10-CM | POA: Diagnosis not present

## 2022-04-21 DIAGNOSIS — I10 Essential (primary) hypertension: Secondary | ICD-10-CM | POA: Diagnosis not present

## 2022-04-24 DIAGNOSIS — E878 Other disorders of electrolyte and fluid balance, not elsewhere classified: Secondary | ICD-10-CM | POA: Diagnosis not present

## 2022-04-24 DIAGNOSIS — I10 Essential (primary) hypertension: Secondary | ICD-10-CM | POA: Diagnosis not present

## 2022-04-24 DIAGNOSIS — M810 Age-related osteoporosis without current pathological fracture: Secondary | ICD-10-CM | POA: Diagnosis not present

## 2022-04-24 DIAGNOSIS — E039 Hypothyroidism, unspecified: Secondary | ICD-10-CM | POA: Diagnosis not present

## 2022-04-24 DIAGNOSIS — N183 Chronic kidney disease, stage 3 unspecified: Secondary | ICD-10-CM | POA: Diagnosis not present

## 2022-04-24 DIAGNOSIS — E1169 Type 2 diabetes mellitus with other specified complication: Secondary | ICD-10-CM | POA: Diagnosis not present

## 2022-04-24 DIAGNOSIS — G4700 Insomnia, unspecified: Secondary | ICD-10-CM | POA: Diagnosis not present

## 2022-04-24 DIAGNOSIS — E7800 Pure hypercholesterolemia, unspecified: Secondary | ICD-10-CM | POA: Diagnosis not present

## 2022-04-27 ENCOUNTER — Other Ambulatory Visit: Payer: Self-pay | Admitting: *Deleted

## 2022-04-27 DIAGNOSIS — D75839 Thrombocytosis, unspecified: Secondary | ICD-10-CM

## 2022-04-27 NOTE — Progress Notes (Signed)
Lab orders entered

## 2022-05-01 ENCOUNTER — Inpatient Hospital Stay: Payer: 59

## 2022-05-01 ENCOUNTER — Other Ambulatory Visit: Payer: Self-pay

## 2022-05-01 ENCOUNTER — Inpatient Hospital Stay: Payer: 59 | Attending: Nurse Practitioner

## 2022-05-01 ENCOUNTER — Inpatient Hospital Stay: Payer: 59 | Admitting: Nurse Practitioner

## 2022-05-01 DIAGNOSIS — Z85850 Personal history of malignant neoplasm of thyroid: Secondary | ICD-10-CM | POA: Diagnosis not present

## 2022-05-01 DIAGNOSIS — Z800 Family history of malignant neoplasm of digestive organs: Secondary | ICD-10-CM | POA: Insufficient documentation

## 2022-05-01 DIAGNOSIS — I10 Essential (primary) hypertension: Secondary | ICD-10-CM | POA: Insufficient documentation

## 2022-05-01 DIAGNOSIS — Z79899 Other long term (current) drug therapy: Secondary | ICD-10-CM | POA: Diagnosis not present

## 2022-05-01 DIAGNOSIS — E119 Type 2 diabetes mellitus without complications: Secondary | ICD-10-CM | POA: Diagnosis not present

## 2022-05-01 DIAGNOSIS — D72820 Lymphocytosis (symptomatic): Secondary | ICD-10-CM | POA: Insufficient documentation

## 2022-05-01 DIAGNOSIS — F32A Depression, unspecified: Secondary | ICD-10-CM | POA: Diagnosis not present

## 2022-05-01 DIAGNOSIS — F419 Anxiety disorder, unspecified: Secondary | ICD-10-CM | POA: Insufficient documentation

## 2022-05-01 LAB — CBC WITH DIFFERENTIAL (CANCER CENTER ONLY)
Abs Immature Granulocytes: 0.02 10*3/uL (ref 0.00–0.07)
Basophils Absolute: 0 10*3/uL (ref 0.0–0.1)
Basophils Relative: 1 %
Eosinophils Absolute: 0.3 10*3/uL (ref 0.0–0.5)
Eosinophils Relative: 3 %
HCT: 42.5 % (ref 36.0–46.0)
Hemoglobin: 14.7 g/dL (ref 12.0–15.0)
Immature Granulocytes: 0 %
Lymphocytes Relative: 43 %
Lymphs Abs: 3.8 10*3/uL (ref 0.7–4.0)
MCH: 29.2 pg (ref 26.0–34.0)
MCHC: 34.6 g/dL (ref 30.0–36.0)
MCV: 84.3 fL (ref 80.0–100.0)
Monocytes Absolute: 0.6 10*3/uL (ref 0.1–1.0)
Monocytes Relative: 7 %
Neutro Abs: 4.1 10*3/uL (ref 1.7–7.7)
Neutrophils Relative %: 46 %
Platelet Count: 279 10*3/uL (ref 150–400)
RBC: 5.04 MIL/uL (ref 3.87–5.11)
RDW: 12.7 % (ref 11.5–15.5)
WBC Count: 8.8 10*3/uL (ref 4.0–10.5)
nRBC: 0 % (ref 0.0–0.2)

## 2022-05-01 LAB — SAVE SMEAR(SSMR), FOR PROVIDER SLIDE REVIEW
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
## 2022-05-02 ENCOUNTER — Encounter: Payer: Self-pay | Admitting: Nurse Practitioner

## 2022-05-02 ENCOUNTER — Inpatient Hospital Stay (HOSPITAL_BASED_OUTPATIENT_CLINIC_OR_DEPARTMENT_OTHER): Payer: 59 | Admitting: Nurse Practitioner

## 2022-05-02 VITALS — BP 133/74 | HR 99 | Temp 98.1°F | Resp 18 | Ht 66.0 in | Wt 163.8 lb

## 2022-05-02 DIAGNOSIS — F419 Anxiety disorder, unspecified: Secondary | ICD-10-CM

## 2022-05-02 DIAGNOSIS — Z800 Family history of malignant neoplasm of digestive organs: Secondary | ICD-10-CM | POA: Diagnosis not present

## 2022-05-02 DIAGNOSIS — E119 Type 2 diabetes mellitus without complications: Secondary | ICD-10-CM | POA: Diagnosis not present

## 2022-05-02 DIAGNOSIS — Z79899 Other long term (current) drug therapy: Secondary | ICD-10-CM | POA: Diagnosis not present

## 2022-05-02 DIAGNOSIS — D72820 Lymphocytosis (symptomatic): Secondary | ICD-10-CM

## 2022-05-02 DIAGNOSIS — Z85850 Personal history of malignant neoplasm of thyroid: Secondary | ICD-10-CM | POA: Diagnosis not present

## 2022-05-02 DIAGNOSIS — D72829 Elevated white blood cell count, unspecified: Secondary | ICD-10-CM

## 2022-05-02 DIAGNOSIS — F32A Depression, unspecified: Secondary | ICD-10-CM

## 2022-05-02 DIAGNOSIS — D440 Neoplasm of uncertain behavior of thyroid gland: Secondary | ICD-10-CM

## 2022-05-02 DIAGNOSIS — I10 Essential (primary) hypertension: Secondary | ICD-10-CM | POA: Diagnosis not present

## 2022-05-04 DIAGNOSIS — L728 Other follicular cysts of the skin and subcutaneous tissue: Secondary | ICD-10-CM | POA: Diagnosis not present

## 2022-05-04 DIAGNOSIS — D485 Neoplasm of uncertain behavior of skin: Secondary | ICD-10-CM | POA: Diagnosis not present

## 2022-05-05 NOTE — Addendum Note (Signed)
Addended by: Owens Shark on: 05/05/2022 12:58 PM   Modules accepted: Orders

## 2022-06-14 DIAGNOSIS — L728 Other follicular cysts of the skin and subcutaneous tissue: Secondary | ICD-10-CM | POA: Diagnosis not present

## 2022-06-14 DIAGNOSIS — L298 Other pruritus: Secondary | ICD-10-CM | POA: Diagnosis not present

## 2022-06-14 DIAGNOSIS — L723 Sebaceous cyst: Secondary | ICD-10-CM | POA: Diagnosis not present

## 2022-06-14 DIAGNOSIS — L538 Other specified erythematous conditions: Secondary | ICD-10-CM | POA: Diagnosis not present

## 2022-06-21 DIAGNOSIS — R1010 Upper abdominal pain, unspecified: Secondary | ICD-10-CM | POA: Diagnosis not present

## 2022-06-21 DIAGNOSIS — K219 Gastro-esophageal reflux disease without esophagitis: Secondary | ICD-10-CM | POA: Diagnosis not present

## 2022-06-23 DIAGNOSIS — E1169 Type 2 diabetes mellitus with other specified complication: Secondary | ICD-10-CM | POA: Diagnosis not present

## 2022-06-23 DIAGNOSIS — E039 Hypothyroidism, unspecified: Secondary | ICD-10-CM | POA: Diagnosis not present

## 2022-06-23 DIAGNOSIS — E7800 Pure hypercholesterolemia, unspecified: Secondary | ICD-10-CM | POA: Diagnosis not present

## 2022-06-23 DIAGNOSIS — D72829 Elevated white blood cell count, unspecified: Secondary | ICD-10-CM | POA: Diagnosis not present

## 2022-06-27 DIAGNOSIS — K760 Fatty (change of) liver, not elsewhere classified: Secondary | ICD-10-CM | POA: Diagnosis not present

## 2022-06-27 DIAGNOSIS — N183 Chronic kidney disease, stage 3 unspecified: Secondary | ICD-10-CM | POA: Diagnosis not present

## 2022-06-27 DIAGNOSIS — E876 Hypokalemia: Secondary | ICD-10-CM | POA: Diagnosis not present

## 2022-06-27 DIAGNOSIS — E1165 Type 2 diabetes mellitus with hyperglycemia: Secondary | ICD-10-CM | POA: Diagnosis not present

## 2022-06-27 DIAGNOSIS — D72829 Elevated white blood cell count, unspecified: Secondary | ICD-10-CM | POA: Diagnosis not present

## 2022-06-27 DIAGNOSIS — I10 Essential (primary) hypertension: Secondary | ICD-10-CM | POA: Diagnosis not present

## 2022-06-27 DIAGNOSIS — E890 Postprocedural hypothyroidism: Secondary | ICD-10-CM | POA: Diagnosis not present

## 2022-06-27 DIAGNOSIS — E1122 Type 2 diabetes mellitus with diabetic chronic kidney disease: Secondary | ICD-10-CM | POA: Diagnosis not present

## 2022-06-27 DIAGNOSIS — D72820 Lymphocytosis (symptomatic): Secondary | ICD-10-CM | POA: Diagnosis not present

## 2022-08-30 DIAGNOSIS — K760 Fatty (change of) liver, not elsewhere classified: Secondary | ICD-10-CM | POA: Diagnosis not present

## 2022-08-30 DIAGNOSIS — M810 Age-related osteoporosis without current pathological fracture: Secondary | ICD-10-CM | POA: Diagnosis not present

## 2022-08-30 DIAGNOSIS — G459 Transient cerebral ischemic attack, unspecified: Secondary | ICD-10-CM | POA: Diagnosis not present

## 2022-08-30 DIAGNOSIS — I10 Essential (primary) hypertension: Secondary | ICD-10-CM | POA: Diagnosis not present

## 2022-08-30 DIAGNOSIS — N200 Calculus of kidney: Secondary | ICD-10-CM | POA: Diagnosis not present

## 2022-08-30 DIAGNOSIS — E890 Postprocedural hypothyroidism: Secondary | ICD-10-CM | POA: Diagnosis not present

## 2022-08-30 DIAGNOSIS — D72828 Other elevated white blood cell count: Secondary | ICD-10-CM | POA: Diagnosis not present

## 2022-08-30 DIAGNOSIS — E119 Type 2 diabetes mellitus without complications: Secondary | ICD-10-CM | POA: Diagnosis not present

## 2022-08-30 DIAGNOSIS — I679 Cerebrovascular disease, unspecified: Secondary | ICD-10-CM | POA: Diagnosis not present

## 2022-09-25 DIAGNOSIS — D72829 Elevated white blood cell count, unspecified: Secondary | ICD-10-CM | POA: Diagnosis not present

## 2022-09-25 DIAGNOSIS — E1169 Type 2 diabetes mellitus with other specified complication: Secondary | ICD-10-CM | POA: Diagnosis not present

## 2022-09-25 DIAGNOSIS — E559 Vitamin D deficiency, unspecified: Secondary | ICD-10-CM | POA: Diagnosis not present

## 2022-09-25 DIAGNOSIS — E039 Hypothyroidism, unspecified: Secondary | ICD-10-CM | POA: Diagnosis not present

## 2022-09-25 DIAGNOSIS — E7800 Pure hypercholesterolemia, unspecified: Secondary | ICD-10-CM | POA: Diagnosis not present

## 2022-09-27 DIAGNOSIS — N183 Chronic kidney disease, stage 3 unspecified: Secondary | ICD-10-CM | POA: Diagnosis not present

## 2022-09-27 DIAGNOSIS — E1122 Type 2 diabetes mellitus with diabetic chronic kidney disease: Secondary | ICD-10-CM | POA: Diagnosis not present

## 2022-09-27 DIAGNOSIS — M810 Age-related osteoporosis without current pathological fracture: Secondary | ICD-10-CM | POA: Diagnosis not present

## 2022-09-27 DIAGNOSIS — E039 Hypothyroidism, unspecified: Secondary | ICD-10-CM | POA: Diagnosis not present

## 2022-09-27 DIAGNOSIS — E7800 Pure hypercholesterolemia, unspecified: Secondary | ICD-10-CM | POA: Diagnosis not present

## 2022-09-27 DIAGNOSIS — Z0000 Encounter for general adult medical examination without abnormal findings: Secondary | ICD-10-CM | POA: Diagnosis not present

## 2022-09-27 DIAGNOSIS — Z9181 History of falling: Secondary | ICD-10-CM | POA: Diagnosis not present

## 2022-11-15 DIAGNOSIS — L910 Hypertrophic scar: Secondary | ICD-10-CM | POA: Diagnosis not present

## 2022-11-15 DIAGNOSIS — Z789 Other specified health status: Secondary | ICD-10-CM | POA: Diagnosis not present

## 2022-12-26 DIAGNOSIS — N183 Chronic kidney disease, stage 3 unspecified: Secondary | ICD-10-CM | POA: Diagnosis not present

## 2022-12-26 DIAGNOSIS — E7800 Pure hypercholesterolemia, unspecified: Secondary | ICD-10-CM | POA: Diagnosis not present

## 2022-12-26 DIAGNOSIS — E1169 Type 2 diabetes mellitus with other specified complication: Secondary | ICD-10-CM | POA: Diagnosis not present

## 2022-12-28 DIAGNOSIS — K219 Gastro-esophageal reflux disease without esophagitis: Secondary | ICD-10-CM | POA: Diagnosis not present

## 2022-12-28 DIAGNOSIS — E876 Hypokalemia: Secondary | ICD-10-CM | POA: Diagnosis not present

## 2022-12-28 DIAGNOSIS — E7800 Pure hypercholesterolemia, unspecified: Secondary | ICD-10-CM | POA: Diagnosis not present

## 2022-12-28 DIAGNOSIS — E119 Type 2 diabetes mellitus without complications: Secondary | ICD-10-CM | POA: Diagnosis not present

## 2022-12-28 DIAGNOSIS — R42 Dizziness and giddiness: Secondary | ICD-10-CM | POA: Diagnosis not present

## 2022-12-28 DIAGNOSIS — I10 Essential (primary) hypertension: Secondary | ICD-10-CM | POA: Diagnosis not present

## 2023-02-13 DIAGNOSIS — E119 Type 2 diabetes mellitus without complications: Secondary | ICD-10-CM | POA: Diagnosis not present

## 2023-02-13 DIAGNOSIS — D72828 Other elevated white blood cell count: Secondary | ICD-10-CM | POA: Diagnosis not present

## 2023-02-13 DIAGNOSIS — N200 Calculus of kidney: Secondary | ICD-10-CM | POA: Diagnosis not present

## 2023-02-13 DIAGNOSIS — C73 Malignant neoplasm of thyroid gland: Secondary | ICD-10-CM | POA: Diagnosis not present

## 2023-02-13 DIAGNOSIS — I6523 Occlusion and stenosis of bilateral carotid arteries: Secondary | ICD-10-CM | POA: Diagnosis not present

## 2023-02-13 DIAGNOSIS — K760 Fatty (change of) liver, not elsewhere classified: Secondary | ICD-10-CM | POA: Diagnosis not present

## 2023-02-13 DIAGNOSIS — E890 Postprocedural hypothyroidism: Secondary | ICD-10-CM | POA: Diagnosis not present

## 2023-02-13 DIAGNOSIS — I10 Essential (primary) hypertension: Secondary | ICD-10-CM | POA: Diagnosis not present

## 2023-02-13 DIAGNOSIS — I679 Cerebrovascular disease, unspecified: Secondary | ICD-10-CM | POA: Diagnosis not present

## 2023-02-13 DIAGNOSIS — M810 Age-related osteoporosis without current pathological fracture: Secondary | ICD-10-CM | POA: Diagnosis not present

## 2023-03-30 DIAGNOSIS — N183 Chronic kidney disease, stage 3 unspecified: Secondary | ICD-10-CM | POA: Diagnosis not present

## 2023-03-30 DIAGNOSIS — E1169 Type 2 diabetes mellitus with other specified complication: Secondary | ICD-10-CM | POA: Diagnosis not present

## 2023-03-30 DIAGNOSIS — E559 Vitamin D deficiency, unspecified: Secondary | ICD-10-CM | POA: Diagnosis not present

## 2023-04-04 DIAGNOSIS — E7800 Pure hypercholesterolemia, unspecified: Secondary | ICD-10-CM | POA: Diagnosis not present

## 2023-04-04 DIAGNOSIS — E559 Vitamin D deficiency, unspecified: Secondary | ICD-10-CM | POA: Diagnosis not present

## 2023-04-04 DIAGNOSIS — I700 Atherosclerosis of aorta: Secondary | ICD-10-CM | POA: Diagnosis not present

## 2023-04-04 DIAGNOSIS — E1169 Type 2 diabetes mellitus with other specified complication: Secondary | ICD-10-CM | POA: Diagnosis not present

## 2023-04-04 DIAGNOSIS — N183 Chronic kidney disease, stage 3 unspecified: Secondary | ICD-10-CM | POA: Diagnosis not present

## 2023-06-13 DIAGNOSIS — C73 Malignant neoplasm of thyroid gland: Secondary | ICD-10-CM | POA: Diagnosis not present

## 2023-06-13 DIAGNOSIS — E1122 Type 2 diabetes mellitus with diabetic chronic kidney disease: Secondary | ICD-10-CM | POA: Diagnosis not present

## 2023-06-13 DIAGNOSIS — I10 Essential (primary) hypertension: Secondary | ICD-10-CM | POA: Diagnosis not present

## 2023-06-13 DIAGNOSIS — Z8673 Personal history of transient ischemic attack (TIA), and cerebral infarction without residual deficits: Secondary | ICD-10-CM | POA: Diagnosis not present

## 2023-06-13 DIAGNOSIS — I679 Cerebrovascular disease, unspecified: Secondary | ICD-10-CM | POA: Diagnosis not present

## 2023-06-13 DIAGNOSIS — M810 Age-related osteoporosis without current pathological fracture: Secondary | ICD-10-CM | POA: Diagnosis not present

## 2023-06-13 DIAGNOSIS — K760 Fatty (change of) liver, not elsewhere classified: Secondary | ICD-10-CM | POA: Diagnosis not present

## 2023-06-13 DIAGNOSIS — R1312 Dysphagia, oropharyngeal phase: Secondary | ICD-10-CM | POA: Diagnosis not present

## 2023-06-13 DIAGNOSIS — I700 Atherosclerosis of aorta: Secondary | ICD-10-CM | POA: Diagnosis not present

## 2023-06-13 DIAGNOSIS — N200 Calculus of kidney: Secondary | ICD-10-CM | POA: Diagnosis not present

## 2023-06-13 DIAGNOSIS — E890 Postprocedural hypothyroidism: Secondary | ICD-10-CM | POA: Diagnosis not present

## 2023-07-02 DIAGNOSIS — E7800 Pure hypercholesterolemia, unspecified: Secondary | ICD-10-CM | POA: Diagnosis not present

## 2023-07-02 DIAGNOSIS — E559 Vitamin D deficiency, unspecified: Secondary | ICD-10-CM | POA: Diagnosis not present

## 2023-07-02 DIAGNOSIS — E1169 Type 2 diabetes mellitus with other specified complication: Secondary | ICD-10-CM | POA: Diagnosis not present

## 2023-07-02 DIAGNOSIS — K760 Fatty (change of) liver, not elsewhere classified: Secondary | ICD-10-CM | POA: Diagnosis not present

## 2023-07-02 DIAGNOSIS — N183 Chronic kidney disease, stage 3 unspecified: Secondary | ICD-10-CM | POA: Diagnosis not present

## 2023-07-04 DIAGNOSIS — E559 Vitamin D deficiency, unspecified: Secondary | ICD-10-CM | POA: Diagnosis not present

## 2023-07-04 DIAGNOSIS — N183 Chronic kidney disease, stage 3 unspecified: Secondary | ICD-10-CM | POA: Diagnosis not present

## 2023-07-04 DIAGNOSIS — E1169 Type 2 diabetes mellitus with other specified complication: Secondary | ICD-10-CM | POA: Diagnosis not present

## 2023-07-04 DIAGNOSIS — E7800 Pure hypercholesterolemia, unspecified: Secondary | ICD-10-CM | POA: Diagnosis not present

## 2023-07-09 DIAGNOSIS — L910 Hypertrophic scar: Secondary | ICD-10-CM | POA: Diagnosis not present

## 2023-07-09 DIAGNOSIS — L680 Hirsutism: Secondary | ICD-10-CM | POA: Diagnosis not present

## 2023-07-09 DIAGNOSIS — L2989 Other pruritus: Secondary | ICD-10-CM | POA: Diagnosis not present

## 2023-10-02 DIAGNOSIS — E1169 Type 2 diabetes mellitus with other specified complication: Secondary | ICD-10-CM | POA: Diagnosis not present

## 2023-10-02 DIAGNOSIS — Z0000 Encounter for general adult medical examination without abnormal findings: Secondary | ICD-10-CM | POA: Diagnosis not present

## 2023-10-02 DIAGNOSIS — E559 Vitamin D deficiency, unspecified: Secondary | ICD-10-CM | POA: Diagnosis not present

## 2023-10-02 DIAGNOSIS — I10 Essential (primary) hypertension: Secondary | ICD-10-CM | POA: Diagnosis not present

## 2023-10-02 DIAGNOSIS — E039 Hypothyroidism, unspecified: Secondary | ICD-10-CM | POA: Diagnosis not present

## 2023-10-02 DIAGNOSIS — N183 Chronic kidney disease, stage 3 unspecified: Secondary | ICD-10-CM | POA: Diagnosis not present

## 2023-10-02 DIAGNOSIS — M810 Age-related osteoporosis without current pathological fracture: Secondary | ICD-10-CM | POA: Diagnosis not present

## 2023-10-02 DIAGNOSIS — E7800 Pure hypercholesterolemia, unspecified: Secondary | ICD-10-CM | POA: Diagnosis not present

## 2023-10-06 DIAGNOSIS — E119 Type 2 diabetes mellitus without complications: Secondary | ICD-10-CM | POA: Diagnosis not present

## 2023-10-31 DIAGNOSIS — E890 Postprocedural hypothyroidism: Secondary | ICD-10-CM | POA: Diagnosis not present

## 2023-10-31 DIAGNOSIS — G4700 Insomnia, unspecified: Secondary | ICD-10-CM | POA: Diagnosis not present

## 2023-10-31 DIAGNOSIS — Z85850 Personal history of malignant neoplasm of thyroid: Secondary | ICD-10-CM | POA: Diagnosis not present

## 2023-12-31 DIAGNOSIS — E7800 Pure hypercholesterolemia, unspecified: Secondary | ICD-10-CM | POA: Diagnosis not present

## 2023-12-31 DIAGNOSIS — E1169 Type 2 diabetes mellitus with other specified complication: Secondary | ICD-10-CM | POA: Diagnosis not present

## 2023-12-31 DIAGNOSIS — E559 Vitamin D deficiency, unspecified: Secondary | ICD-10-CM | POA: Diagnosis not present

## 2023-12-31 DIAGNOSIS — N183 Chronic kidney disease, stage 3 unspecified: Secondary | ICD-10-CM | POA: Diagnosis not present

## 2024-01-01 DIAGNOSIS — E559 Vitamin D deficiency, unspecified: Secondary | ICD-10-CM | POA: Diagnosis not present

## 2024-01-01 DIAGNOSIS — E1169 Type 2 diabetes mellitus with other specified complication: Secondary | ICD-10-CM | POA: Diagnosis not present

## 2024-01-01 DIAGNOSIS — I10 Essential (primary) hypertension: Secondary | ICD-10-CM | POA: Diagnosis not present

## 2024-01-01 DIAGNOSIS — K219 Gastro-esophageal reflux disease without esophagitis: Secondary | ICD-10-CM | POA: Diagnosis not present

## 2024-01-01 DIAGNOSIS — R42 Dizziness and giddiness: Secondary | ICD-10-CM | POA: Diagnosis not present

## 2024-01-01 DIAGNOSIS — E7800 Pure hypercholesterolemia, unspecified: Secondary | ICD-10-CM | POA: Diagnosis not present
